# Patient Record
Sex: Female | Born: 1975 | Race: White | Hispanic: Yes | Marital: Married | Smoking: Never smoker
Health system: Southern US, Community
[De-identification: ages and names within clinical notes are randomized; demographics above are authoritative.]

## PROBLEM LIST (undated history)

## (undated) ENCOUNTER — Inpatient Hospital Stay (HOSPITAL_COMMUNITY): Payer: Self-pay

## (undated) DIAGNOSIS — O139 Gestational [pregnancy-induced] hypertension without significant proteinuria, unspecified trimester: Secondary | ICD-10-CM

## (undated) DIAGNOSIS — Z8619 Personal history of other infectious and parasitic diseases: Secondary | ICD-10-CM

## (undated) DIAGNOSIS — IMO0002 Reserved for concepts with insufficient information to code with codable children: Secondary | ICD-10-CM

## (undated) DIAGNOSIS — O1400 Mild to moderate pre-eclampsia, unspecified trimester: Secondary | ICD-10-CM

## (undated) HISTORY — DX: Personal history of other infectious and parasitic diseases: Z86.19

## (undated) HISTORY — PX: NO PAST SURGERIES: SHX2092

---

## 2001-02-22 ENCOUNTER — Emergency Department (HOSPITAL_COMMUNITY): Admission: EM | Admit: 2001-02-22 | Discharge: 2001-02-22 | Payer: Self-pay | Admitting: Emergency Medicine

## 2001-02-22 ENCOUNTER — Encounter: Payer: Self-pay | Admitting: Emergency Medicine

## 2001-03-26 ENCOUNTER — Encounter: Payer: Self-pay | Admitting: Orthopedic Surgery

## 2001-03-26 ENCOUNTER — Encounter: Admission: RE | Admit: 2001-03-26 | Discharge: 2001-03-26 | Payer: Self-pay | Admitting: Orthopedic Surgery

## 2001-07-06 ENCOUNTER — Inpatient Hospital Stay (HOSPITAL_COMMUNITY): Admission: AD | Admit: 2001-07-06 | Discharge: 2001-07-06 | Payer: Self-pay | Admitting: Obstetrics & Gynecology

## 2001-07-14 ENCOUNTER — Inpatient Hospital Stay (HOSPITAL_COMMUNITY): Admission: AD | Admit: 2001-07-14 | Discharge: 2001-07-14 | Payer: Self-pay | Admitting: *Deleted

## 2001-07-14 ENCOUNTER — Encounter: Payer: Self-pay | Admitting: *Deleted

## 2001-09-18 ENCOUNTER — Other Ambulatory Visit: Admission: RE | Admit: 2001-09-18 | Discharge: 2001-09-18 | Payer: Self-pay | Admitting: *Deleted

## 2002-03-08 ENCOUNTER — Inpatient Hospital Stay (HOSPITAL_COMMUNITY): Admission: AD | Admit: 2002-03-08 | Discharge: 2002-03-08 | Payer: Self-pay | Admitting: *Deleted

## 2002-03-15 ENCOUNTER — Encounter (INDEPENDENT_AMBULATORY_CARE_PROVIDER_SITE_OTHER): Payer: Self-pay

## 2002-03-15 ENCOUNTER — Inpatient Hospital Stay (HOSPITAL_COMMUNITY): Admission: AD | Admit: 2002-03-15 | Discharge: 2002-03-19 | Payer: Self-pay | Admitting: Obstetrics and Gynecology

## 2002-04-16 ENCOUNTER — Other Ambulatory Visit: Admission: RE | Admit: 2002-04-16 | Discharge: 2002-04-16 | Payer: Self-pay | Admitting: *Deleted

## 2004-02-09 ENCOUNTER — Encounter: Admission: RE | Admit: 2004-02-09 | Discharge: 2004-02-09 | Payer: Self-pay | Admitting: Obstetrics and Gynecology

## 2004-02-17 ENCOUNTER — Emergency Department (HOSPITAL_COMMUNITY): Admission: EM | Admit: 2004-02-17 | Discharge: 2004-02-17 | Payer: Self-pay | Admitting: Emergency Medicine

## 2005-03-07 ENCOUNTER — Emergency Department (HOSPITAL_COMMUNITY): Admission: EM | Admit: 2005-03-07 | Discharge: 2005-03-07 | Payer: Self-pay | Admitting: *Deleted

## 2005-03-16 ENCOUNTER — Emergency Department (HOSPITAL_COMMUNITY): Admission: EM | Admit: 2005-03-16 | Discharge: 2005-03-16 | Payer: Self-pay | Admitting: Emergency Medicine

## 2005-08-16 ENCOUNTER — Other Ambulatory Visit: Admission: RE | Admit: 2005-08-16 | Discharge: 2005-08-16 | Payer: Self-pay | Admitting: Family Medicine

## 2007-10-28 ENCOUNTER — Other Ambulatory Visit: Admission: RE | Admit: 2007-10-28 | Discharge: 2007-10-28 | Payer: Self-pay | Admitting: Family Medicine

## 2009-10-06 ENCOUNTER — Inpatient Hospital Stay (HOSPITAL_COMMUNITY): Admission: AD | Admit: 2009-10-06 | Discharge: 2009-10-10 | Payer: Self-pay | Admitting: Obstetrics and Gynecology

## 2010-10-21 LAB — COMPREHENSIVE METABOLIC PANEL
AST: 22 U/L (ref 0–37)
AST: 55 U/L — ABNORMAL HIGH (ref 0–37)
Albumin: 2.2 g/dL — ABNORMAL LOW (ref 3.5–5.2)
Alkaline Phosphatase: 119 U/L — ABNORMAL HIGH (ref 39–117)
Alkaline Phosphatase: 85 U/L (ref 39–117)
CO2: 26 mEq/L (ref 19–32)
Calcium: 8.2 mg/dL — ABNORMAL LOW (ref 8.4–10.5)
Chloride: 106 mEq/L (ref 96–112)
Creatinine, Ser: 0.53 mg/dL (ref 0.4–1.2)
GFR calc Af Amer: >60 mL/min (ref >60)
GFR calc non Af Amer: >60 mL/min (ref >60)
GFR calc non Af Amer: >60 mL/min (ref >60)
Glucose, Bld: 94 mg/dL (ref 70–99)
Potassium: 3.5 mEq/L (ref 3.5–5.1)
Potassium: 3.9 mEq/L (ref 3.5–5.1)
Sodium: 138 mEq/L (ref 135–145)
Total Bilirubin: 0.3 mg/dL (ref 0.3–1.2)
Total Bilirubin: 0.3 mg/dL (ref 0.3–1.2)
Total Protein: 5.9 g/dL — ABNORMAL LOW (ref 6.0–8.3)

## 2010-10-21 LAB — CBC
HCT: 32 % — ABNORMAL LOW (ref 36.0–46.0)
Hemoglobin: 11.9 g/dL — ABNORMAL LOW (ref 12.0–15.0)
MCHC: 33.5 g/dL (ref 30.0–36.0)
Platelets: 235 10*3/uL (ref 150–400)
RBC: 3.24 MIL/uL — ABNORMAL LOW (ref 3.87–5.11)
RBC: 3.71 MIL/uL — ABNORMAL LOW (ref 3.87–5.11)
RBC: 4.07 MIL/uL (ref 3.87–5.11)
RDW: 16.9 % — ABNORMAL HIGH (ref 11.5–15.5)
WBC: 6.1 10*3/uL (ref 4.0–10.5)

## 2010-10-21 LAB — RPR: RPR Ser Ql: NONREACTIVE

## 2010-10-21 LAB — URIC ACID: Uric Acid, Serum: 5.2 mg/dL (ref 2.4–7.0)

## 2010-10-21 LAB — MAGNESIUM: Magnesium: 4.6 mg/dL — ABNORMAL HIGH (ref 1.5–2.5)
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
## 2010-12-14 NOTE — Discharge Summary (Signed)
NAME:  OLUKEMI, PANCHAL                        ACCOUNT NO.:  [id number]   MEDICAL RECORD NO.:  [id number]                   PATIENT TYPE:  INP   LOCATION:  9146                                 FACILITY:  [facility]   PHYSICIAN:  Sheronette A. Cherly Hensen, M.D.         DATE OF BIRTH:  June 14, 1976   DATE OF ADMISSION:  03/15/2002  DATE OF DISCHARGE:  03/19/2002                                 DISCHARGE SUMMARY   ADMISSION DIAGNOSES:  1. Intrauterine gestation at 40 and 4/7 weeks.  2. Post dates.   DISCHARGE DIAGNOSES:  1. Term gestation delivered.  2. Postpartum endometritis.  3. Gestational anemia.   HISTORY OF PRESENT ILLNESS:  A 35 year old Hispanic female at 4 and 4/[redacted]  weeks gestation admitted by Dr. Delia Heady for induction of labor secondary  to post dates.   HOSPITAL COURSE:  The patient was admitted to [facility].  Her  cervical examination was 1, 50%, -1.  She was given Cervidil and started  subsequently with Pitocin the next day.  She underwent amniotomy, clear  fluid noted.  The patient subsequently progressed quickly, pushed well.  However, with variable decelerations noted on external monitor.  The patient  subsequently pushed for an hour and a half.  A midline episiotomy was  subsequently performed.  Delivery of a live female was accomplished.  Cord  around the neck x2 which was reducible.  Moderate meconium was noted.  Apgars of 3, 5, and 8 by the neonatology team.  Cord pH was performed.  Placenta was meconium stained and sent to pathology.  Pathology revealed a  mature meconium stained placenta.  Weight of the baby was 7 pounds 9 ounces,  21 inches long.  Midline episiotomy without extension repaired.  On the day  of delivery the patient spiked a temperature to 101.2.  She was subsequently  started on intravenous Cefotetan 2 g q.12h.  Antibiotics discontinued as the  patient was afebrile for 24 hours.  On postpartum day number three patient  was afebrile.  Abdomen  was not tender.  Her CBC on postpartum day number one  showed a hemoglobin of 9.6, hematocrit 28.3, platelet count 266,000, white  count 16.3.  She was admitted with a hemoglobin of 10.5.   DISPOSITION:  Home.   CONDITION ON DISCHARGE:  Stable.   DISCHARGE MEDICATIONS:  1. Cephalexin 500 mg one p.o. q.6h. for 10 days.  2. Prenatal vitamins one p.o. q.d.  3. Ferrous sulfate one p.o. q.d.   FOLLOW UP:  At [facility] in one week.    DISCHARGE INSTRUCTIONS:  Call for temperature greater or equal to 100.4.  Nothing per vagina for four to six weeks.  No heavy lifting or driving for  two to three weeks.  Call if increased vaginal bleeding, severe abdominal  pain.  Sheronette A. Cherly Hensen, M.D.    SAC/MEDQ  D:  04/16/2002  T:  04/19/2002  Job:  [id number]

## 2010-12-14 NOTE — Consult Note (Signed)
   NAME:  ERISA, MEHLMAN NO.:  [id number]   MEDICAL RECORD NO.:  [id number]                   PATIENT TYPE:   LOCATION:                                       FACILITY:   PHYSICIAN:  Lenoard Aden, M.D.             DATE OF BIRTH:   DATE OF CONSULTATION:  03/08/2002  DATE OF DISCHARGE:                                OB/GYN CONSULTATION   CHIEF COMPLAINT:  Rule out labor.   HISTORY OF PRESENT ILLNESS:  The patient is a G1, P0, Hispanic female at 40+  weeks, who presents with active complaints of contractions.   MEDICATIONS:  Prenatal vitamins.   PAST MEDICAL HISTORY:  Noncontributory.   FAMILY HISTORY:  Noncontributory.   SOCIAL HISTORY:  Noncontributory.   PHYSICAL EXAMINATION:  GENERAL:  She is a well-developed, well-nourished,  Hispanic female in no apparent distress.  HEENT:  Normal.  LUNGS:  Clear.  HEART:  Regular rhythm.  ABDOMEN:  Soft, gravid, nontender, no right upper quadrant tenderness.  PULSES:  DTRs 2+.  No evidence of clonus.  PELVIC:  Cervix is 1-2 cm, 50%, thick, vertex, -2, and posterior.  EXTREMITIES:  No cords.  NEUROLOGIC:  Nonfocal.  SKIN:  Nonfocal.   FETAL MONITORING:  NST is reactive.  Fetal heart rate 150-160 with  accelerations, irregular contractions after ambulation.  No cervical change  is noted.   IMPRESSION:  1. Prodromal labor pattern.  2. No evidence of active labor.   PLAN:  1. Discharge home.  2. Ambien pack given.  3. Follow up in the office one day.                                              Lenoard Aden, M.D.   RJT/MEDQ  D:  03/08/2002  T:  03/08/2002  Job:  [id number]

## 2011-02-19 ENCOUNTER — Other Ambulatory Visit: Payer: Self-pay | Admitting: Obstetrics & Gynecology

## 2011-03-29 LAB — GC/CHLAMYDIA PROBE AMP, GENITAL
Chlamydia: NEGATIVE
Gonorrhea: NEGATIVE

## 2011-03-29 LAB — RUBELLA ANTIBODY, IGM: Rubella: IMMUNE

## 2011-03-29 LAB — HIV ANTIBODY (ROUTINE TESTING W REFLEX): HIV: NONREACTIVE

## 2011-03-29 LAB — SYPHILIS: RPR W/REFLEX TO RPR TITER AND TREPONEMAL ANTIBODIES, TRADITIONAL SCREENING AND DIAGNOSIS ALGORITHM: RPR: NONREACTIVE

## 2011-07-11 LAB — ABO/RH: RH Type: POSITIVE

## 2011-07-11 LAB — HIV ANTIBODY (ROUTINE TESTING W REFLEX): HIV: NONREACTIVE

## 2011-07-11 LAB — RPR: RPR: NONREACTIVE

## 2011-07-30 NOTE — L&D Delivery Note (Signed)
Patient was C/C/2 and pushed for 10 minutes with epidural.   NSVD  female infant, Apgars 9/9, weight 8#5.   The patient had a second degree perineal laceration repaired with 2=0 and 3-0 vicryl. Fundus was firm. EBL was expected. Placenta was delivered intact. Vagina was clear.  Baby was vigorous to bedside.  Debra Montgomery

## 2011-08-30 DIAGNOSIS — O479 False labor, unspecified: Secondary | ICD-10-CM

## 2011-09-01 ENCOUNTER — Inpatient Hospital Stay (HOSPITAL_COMMUNITY)
Admission: AD | Admit: 2011-09-01 | Discharge: 2011-09-01 | Disposition: A | Discharge status: Home / Self Care | Payer: 59 | Source: Ambulatory Visit | Attending: Obstetrics and Gynecology | Admitting: Obstetrics and Gynecology

## 2011-09-01 ENCOUNTER — Encounter (HOSPITAL_COMMUNITY): Payer: Self-pay | Admitting: *Deleted

## 2011-09-01 DIAGNOSIS — O4700 False labor before 37 completed weeks of gestation, unspecified trimester: Secondary | ICD-10-CM | POA: Insufficient documentation

## 2011-09-01 DIAGNOSIS — O2210 Genital varices in pregnancy, unspecified trimester: Secondary | ICD-10-CM | POA: Insufficient documentation

## 2011-09-01 DIAGNOSIS — N949 Unspecified condition associated with female genital organs and menstrual cycle: Secondary | ICD-10-CM | POA: Insufficient documentation

## 2011-09-01 DIAGNOSIS — O479 False labor, unspecified: Secondary | ICD-10-CM

## 2011-09-01 LAB — URINALYSIS, ROUTINE W REFLEX MICROSCOPIC
Bilirubin Urine: NEGATIVE
Glucose, UA: NEGATIVE mg/dL
Ketones, ur: NEGATIVE mg/dL
Specific Gravity, Urine: 1.025 (ref 1.005–1.030)
Urobilinogen, UA: 1 mg/dL (ref 0.0–1.0)
pH: 6 (ref 5.0–8.0)

## 2011-09-01 MED ORDER — NIFEDIPINE 10 MG PO CAPS
ORAL_CAPSULE | ORAL | Status: AC
  Administered 2011-09-01: 10 mg via ORAL
  Filled 2011-09-01: qty 1

## 2011-09-01 MED ORDER — NIFEDIPINE 10 MG PO CAPS: 10.0000 mg | ORAL_CAPSULE | Freq: Once | ORAL | Status: DC

## 2011-09-01 NOTE — ED Provider Notes (Signed)
History     Chief Complaint  Patient presents with  . Pelvic Pain   HPI Debra Montgomery is 36 y.o. G3P2002 [redacted] wks gestation presents  with pressure in her lower abdomen=points to suprapubic bone , began at 10pm last night.  Described as 'heavy", laid down for a while on her left side, went to bathroom and saw a little blood.  Went back to bed and called MD this am.   Painful to stand up and walk.   No pain with resting.  Denies leaking of fluid.  Hx of preeclampsia in previous pregnancy.  Pt denies PIH sxs with this pregnancy.   No past medical history on file.  No past surgical history on file.  No family history on file.  History  Substance Use Topics  . Smoking status: Not on file  . Smokeless tobacco: Not on file  . Alcohol Use: Not on file    Allergies: No Known Allergies  Prescriptions prior to admission  Medication Sig Dispense Refill  . acetaminophen (TYLENOL) 325 MG tablet Take 650 mg by mouth every 6 (six) hours as needed. For pain      . ferrous sulfate 325 (65 FE) MG tablet Take 325 mg by mouth daily with breakfast.      . Prenatal Vit-Fe Fumarate-FA (PRENATAL MULTIVITAMIN) TABS Take 1 tablet by mouth daily.        Review of Systems  Constitutional: Negative.   HENT: Negative.   Respiratory: Negative.   Cardiovascular: Negative.   Gastrointestinal: Positive for abdominal pain (lower abdominal pressure).  Genitourinary:       + for blood    Physical Exam   Blood pressure 117/61, pulse 76, temperature 98.9 F (37.2 C), temperature source Oral, resp. rate 18, height 5\' 5"  (1.651 m), weight 170 lb (77.111 kg).  Physical Exam  Constitutional: She is oriented to person, place, and time. She appears well-developed and well-nourished. No distress.  HENT:  Head: Normocephalic.  Neck: Normal range of motion.  Cardiovascular: Normal rate.   Respiratory: Effort normal.  GI: Soft. There is tenderness (mild lower abdominal tenderness).  Genitourinary: Uterus is  enlarged. Uterus is not tender. No bleeding around the vagina. Vaginal discharge (white normal appearing without odor.   Neg for pooling of fluid) found.       Varicosities noted just inside the introitus  CERVICAL EXAM by Carollee Herter, RN 1cm diltated, 30% effaced and thick.  Neurological: She is alert and oriented to person, place, and time.  Skin: Skin is warm and dry.    Results for orders placed during the hospital encounter of 09/01/11 (from the past 24 hour(s))  URINALYSIS, ROUTINE W REFLEX MICROSCOPIC     Status: Normal   Collection Time   09/01/11  4:40 PM      Component Value Range   Color, Urine YELLOW  YELLOW    APPearance CLEAR  CLEAR    Specific Gravity, Urine 1.025  1.005 - 1.030    pH 6.0  5.0 - 8.0    Glucose, UA NEGATIVE  NEGATIVE (mg/dL)   Hgb urine dipstick NEGATIVE  NEGATIVE    Bilirubin Urine NEGATIVE  NEGATIVE    Ketones, ur NEGATIVE  NEGATIVE (mg/dL)   Protein, ur NEGATIVE  NEGATIVE (mg/dL)   Urobilinogen, UA 1.0  0.0 - 1.0 (mg/dL)   Nitrite NEGATIVE  NEGATIVE    Leukocytes, UA NEGATIVE  NEGATIVE    FMS  Baseline 140, moderate variability.  Not yet reactive.  3 contractions  in 40 minutes.   At 18:23 strip is reactive.  Baseline 150.  Contractions q 6-7 minute   Order given to recheck cervix.  If unchanged, discharge to home.   CERVICAL EXAM at 18:31  Unchanged from previous exam per Carollee Herter, RN.  MAU Course  Procedures  MDM 18:31  Reported FMS, physical exam findings, and cervical exam to Dr. Henderson Cloud.  Order given for Procardia 10mg  po now.   May discharge to home.  Procardia 10mg  po given in MAU  Assessment and Plan  A:  Braxton-Hicks contractions      Vaginal varicosities  P:  Keep scheduled appointment.        Call Dr. Tenny Craw if contractions increase  KEY,EVE M 09/01/2011, 5:18 PM   Matt Holmes, NP 09/01/11 1845  Matt Holmes, NP 09/01/11 1610

## 2011-09-01 NOTE — Progress Notes (Signed)
Apple juice given to pt  

## 2011-09-01 NOTE — Progress Notes (Signed)
Pt reports increased pelvic pressure that started last night. Reports she had some vaginal bleeding as well that has stopped today. Reports good fetal  Movement.

## 2011-09-01 NOTE — Progress Notes (Signed)
Updated E Key NP on UA results.

## 2011-09-26 ENCOUNTER — Inpatient Hospital Stay (HOSPITAL_COMMUNITY)
Admission: AD | Admit: 2011-09-26 | Discharge: 2011-09-26 | Disposition: A | Discharge status: Home / Self Care | Payer: 59 | Source: Ambulatory Visit | Attending: Obstetrics and Gynecology | Admitting: Obstetrics and Gynecology

## 2011-09-26 ENCOUNTER — Encounter (HOSPITAL_COMMUNITY): Payer: Self-pay | Admitting: *Deleted

## 2011-09-26 DIAGNOSIS — O479 False labor, unspecified: Secondary | ICD-10-CM | POA: Insufficient documentation

## 2011-09-26 HISTORY — DX: Mild to moderate pre-eclampsia, unspecified trimester: O14.00

## 2011-09-26 LAB — CBC
HCT: 37.1 % (ref 36.0–46.0)
MCH: 27.3 pg (ref 26.0–34.0)
MCHC: 32.3 g/dL (ref 30.0–36.0)
WBC: 8.5 10*3/uL (ref 4.0–10.5)

## 2011-09-26 LAB — COMPREHENSIVE METABOLIC PANEL
CO2: 22 mEq/L (ref 19–32)
Calcium: 8.5 mg/dL (ref 8.4–10.5)
Creatinine, Ser: 0.42 mg/dL — ABNORMAL LOW (ref 0.50–1.10)
GFR calc Af Amer: >90 mL/min (ref >90)
GFR calc non Af Amer: >90 mL/min (ref >90)
Glucose, Bld: 100 mg/dL — ABNORMAL HIGH (ref 70–99)

## 2011-09-26 LAB — URINALYSIS, ROUTINE W REFLEX MICROSCOPIC
Hgb urine dipstick: NEGATIVE
Specific Gravity, Urine: 1.015 (ref 1.005–1.030)

## 2011-09-26 LAB — LACTATE DEHYDROGENASE: LDH: 176 U/L (ref 94–250)

## 2011-09-26 NOTE — ED Provider Notes (Signed)
History     CSN: [id number]  Arrival date & time 09/26/11  1054   None     Chief Complaint  Patient presents with  . Labor Eval    HPI Debra Montgomery is a 36 y.o. female @ [redacted]w[redacted]d  who presents to MAU for contractions. Her blood pressure was slightly elevated on arrival.  Past Medical History  Diagnosis Date  . Pre-eclampsia, mild     with previous delivery    Past Surgical History  Procedure Date  . No past surgeries     No family history on file.  History  Substance Use Topics  . Smoking status: Never Smoker   . Smokeless tobacco: Not on file  . Alcohol Use: No    OB History    Grav Para Term Preterm Abortions TAB SAB Ect Mult Living   3 2 2  0 0 0 0 0 0 2      Review of Systems: As stated in HPI.  Allergies  Review of patient's allergies indicates no known allergies.  Home Medications  No current outpatient prescriptions on file.  BP 129/85  Pulse 83  Temp(Src) 98.2 F (36.8 C) (Oral)  Resp 18  Physical Exam  Nursing note and vitals reviewed. Constitutional: She is oriented to person, place, and time. She appears well-developed and well-nourished.  HENT:  Head: Normocephalic.  Eyes: EOM are normal.  Neck: Neck supple.  Cardiovascular: Normal rate.   Pulmonary/Chest: Effort normal.  Abdominal: Soft.       Gravid, pain with contractions.  Musculoskeletal: Normal range of motion.  Neurological: She is alert and oriented to person, place, and time. No cranial nerve deficit.  Skin: Skin is warm and dry.  Psychiatric: She has a normal mood and affect. Her behavior is normal. Judgment and thought content normal.    ED Course: Dr. Henderson Cloud notified and request [facility] labs be drawn.  Procedures  Results for orders placed during the hospital encounter of 09/26/11 (from the past 24 hour(s))  CBC     Status: Abnormal   Collection Time   09/26/11 11:40 AM      Component Value Range   WBC 8.5  4.0 - 10.5 (K/uL)   RBC 4.40  3.87 - 5.11 (MIL/uL)   Hemoglobin 12.0  12.0 - 15.0 (g/dL)   HCT 08.6  57.8 - 46.9 (%)   MCV 84.3  78.0 - 100.0 (fL)   MCH 27.3  26.0 - 34.0 (pg)   MCHC 32.3  30.0 - 36.0 (g/dL)   RDW 62.9 (*) 52.8 - 15.5 (%)   Platelets 240  150 - 400 (K/uL)  COMPREHENSIVE METABOLIC PANEL     Status: Abnormal   Collection Time   09/26/11 11:40 AM      Component Value Range   Sodium 132 (*) 135 - 145 (mEq/L)   Potassium 3.8  3.5 - 5.1 (mEq/L)   Chloride 100  96 - 112 (mEq/L)   CO2 22  19 - 32 (mEq/L)   Glucose, Bld 100 (*) 70 - 99 (mg/dL)   BUN 9  6 - 23 (mg/dL)   Creatinine, Ser 4.13 (*) 0.50 - 1.10 (mg/dL)   Calcium 8.5  8.4 - 24.4 (mg/dL)   Total Protein 6.7  6.0 - 8.3 (g/dL)   Albumin 2.7 (*) 3.5 - 5.2 (g/dL)   AST 16  0 - 37 (U/L)   ALT 14  0 - 35 (U/L)   Alkaline Phosphatase 126 (*) 39 - 117 (U/L)  Total Bilirubin 0.3  0.3 - 1.2 (mg/dL)   GFR calc non Af Amer >90  >90 (mL/min)   GFR calc Af Amer >90  >90 (mL/min)  URIC ACID     Status: Normal   Collection Time   09/26/11 11:40 AM      Component Value Range   Uric Acid, Serum 4.2  2.4 - 7.0 (mg/dL)  LACTATE DEHYDROGENASE     Status: Normal   Collection Time   09/26/11 11:40 AM      Component Value Range   LD 176  94 - 250 (U/L)  URINALYSIS, ROUTINE W REFLEX MICROSCOPIC     Status: Abnormal   Collection Time   09/26/11 12:00 PM      Component Value Range   Color, Urine YELLOW  YELLOW    APPearance CLEAR  CLEAR    Specific Gravity, Urine 1.015  1.005 - 1.030    pH 6.5  5.0 - 8.0    Glucose, UA NEGATIVE  NEGATIVE (mg/dL)   Hgb urine dipstick NEGATIVE  NEGATIVE    Bilirubin Urine NEGATIVE  NEGATIVE    Ketones, ur 15 (*) NEGATIVE (mg/dL)   Protein, ur NEGATIVE  NEGATIVE (mg/dL)   Urobilinogen, UA 0.2  0.0 - 1.0 (mg/dL)   Nitrite NEGATIVE  NEGATIVE    Leukocytes, UA NEGATIVE  NEGATIVE    MDM: EFM tracing reactive, contractions every 6 to 8 minutes, cervix 1.5 cm, 60%, -2.   Assessment: False Labor  Plan:  Follow up in the office   Return here as  needed.       [place], [place] 09/26/11 [phone]

## 2011-09-26 NOTE — Progress Notes (Signed)
Pt reports having contraction on and off all morning. Re[ports 5-6 min apart. Denies SROM or bleeding ng reports good fetal movement

## 2011-09-26 NOTE — H&P (Signed)
36 y.o. [redacted]w[redacted]d  G3P2002 comes in c/o contractions.  Otherwise has good fetal movement and no bleeding.  Found to have some elevated BPs but no s/s preeclampsia.  Past Medical History  Diagnosis Date  . Pre-eclampsia, mild     with previous delivery    Past Surgical History  Procedure Date  . No past surgeries     OB History    Grav Para Term Preterm Abortions TAB SAB Ect Mult Living   3 2 2  0 0 0 0 0 0 2     # Outc Date GA Lbr Len/2nd Wgt Sex Del Anes PTL Lv   1 TRM            2 TRM            3 CUR               History   Social History  . Marital Status: Married    Spouse Name: N/A    Number of Children: N/A  . Years of Education: N/A   Occupational History  . Not on file.   Social History Main Topics  . Smoking status: Never Smoker   . Smokeless tobacco: Not on file  . Alcohol Use: No  . Drug Use: No  . Sexually Active: Yes   Other Topics Concern  . Not on file   Social History Narrative  . No narrative on file   Review of patient's allergies indicates no known allergies.   Prenatal Course:  Uncomplicated; AMA but Quad screen normal.  Filed Vitals:   09/26/11 1202 09/26/11 1218 09/26/11 1233 09/26/11 1248  BP: 134/86 143/86 130/79 141/88  Pulse: 80 76 85 83  Temp:      TempSrc:      Resp:       Lungs/Cor:  NAD Abdomen:  soft, gravid Ex:  no cords, erythema SVE:  See NP note FHTs: NST R Toco:  q6-8  Results for orders placed during the hospital encounter of 09/26/11 (from the past 48 hour(s))  CBC     Status: Abnormal   Collection Time   09/26/11 11:40 AM      Component Value Range Comment   WBC 8.5  4.0 - 10.5 (K/uL)    RBC 4.40  3.87 - 5.11 (MIL/uL)    Hemoglobin 12.0  12.0 - 15.0 (g/dL)    HCT 16.1  09.6 - 04.5 (%)    MCV 84.3  78.0 - 100.0 (fL)    MCH 27.3  26.0 - 34.0 (pg)    MCHC 32.3  30.0 - 36.0 (g/dL)    RDW 40.9 (*) 81.1 - 15.5 (%)    Platelets 240  150 - 400 (K/uL)   COMPREHENSIVE METABOLIC PANEL     Status: Abnormal   Collection Time   09/26/11 11:40 AM      Component Value Range Comment   Sodium 132 (*) 135 - 145 (mEq/L)    Potassium 3.8  3.5 - 5.1 (mEq/L)    Chloride 100  96 - 112 (mEq/L)    CO2 22  19 - 32 (mEq/L)    Glucose, Bld 100 (*) 70 - 99 (mg/dL)    BUN 9  6 - 23 (mg/dL)    Creatinine, Ser 9.14 (*) 0.50 - 1.10 (mg/dL)    Calcium 8.5  8.4 - 10.5 (mg/dL)    Total Protein 6.7  6.0 - 8.3 (g/dL)    Albumin 2.7 (*) 3.5 - 5.2 (g/dL)  AST 16  0 - 37 (U/L)    ALT 14  0 - 35 (U/L) REPEATED TO VERIFY   Alkaline Phosphatase 126 (*) 39 - 117 (U/L)    Total Bilirubin 0.3  0.3 - 1.2 (mg/dL)    GFR calc non Af Amer >90  >90 (mL/min)    GFR calc Af Amer >90  >90 (mL/min)   URIC ACID     Status: Normal   Collection Time   09/26/11 11:40 AM      Component Value Range Comment   Uric Acid, Serum 4.2  2.4 - 7.0 (mg/dL)   LACTATE DEHYDROGENASE     Status: Normal   Collection Time   09/26/11 11:40 AM      Component Value Range Comment   LD 176  94 - 250 (U/L)   URINALYSIS, ROUTINE W REFLEX MICROSCOPIC     Status: Abnormal   Collection Time   09/26/11 12:00 PM      Component Value Range Comment   Color, Urine YELLOW  YELLOW     APPearance CLEAR  CLEAR     Specific Gravity, Urine 1.015  1.005 - 1.030     pH 6.5  5.0 - 8.0     Glucose, UA NEGATIVE  NEGATIVE (mg/dL)    Hgb urine dipstick NEGATIVE  NEGATIVE     Bilirubin Urine NEGATIVE  NEGATIVE     Ketones, ur 15 (*) NEGATIVE (mg/dL)    Protein, ur NEGATIVE  NEGATIVE (mg/dL)    Urobilinogen, UA 0.2  0.0 - 1.0 (mg/dL)    Nitrite NEGATIVE  NEGATIVE     Leukocytes, UA NEGATIVE  NEGATIVE  MICROSCOPIC NOT DONE ON URINES WITH NEGATIVE PROTEIN, BLOOD, LEUKOCYTES, NITRITE, OR GLUCOSE <1000 mg/dL.   A/P   Term pregnancy with mild contractions.  If doesn't change cervix, d/c to home.  GBS positive.    Abryana Lykens A

## 2011-09-26 NOTE — Progress Notes (Signed)
No change in SVE. B/p 130-140-70-80. OK to sent pt home.

## 2011-09-26 NOTE — Progress Notes (Signed)
Notified of SVE 1.5/long/posterior and b/p elevated. PIH labs ordered.

## 2011-09-26 NOTE — Progress Notes (Signed)
Notified of no change in SVE and Normal PIH labs. Still wants to monitor pt x1 hr and recheck cervix.

## 2011-09-30 ENCOUNTER — Encounter (HOSPITAL_COMMUNITY): Payer: Self-pay

## 2011-09-30 ENCOUNTER — Inpatient Hospital Stay (HOSPITAL_COMMUNITY)
Admission: AD | Admit: 2011-09-30 | Discharge: 2011-09-30 | Disposition: A | Discharge status: Home / Self Care | Payer: 59 | Source: Ambulatory Visit | Attending: Obstetrics and Gynecology | Admitting: Obstetrics and Gynecology

## 2011-09-30 DIAGNOSIS — R519 Headache, unspecified: Secondary | ICD-10-CM

## 2011-09-30 DIAGNOSIS — R51 Headache: Secondary | ICD-10-CM

## 2011-09-30 DIAGNOSIS — O479 False labor, unspecified: Secondary | ICD-10-CM | POA: Insufficient documentation

## 2011-09-30 DIAGNOSIS — O99891 Other specified diseases and conditions complicating pregnancy: Secondary | ICD-10-CM | POA: Insufficient documentation

## 2011-09-30 DIAGNOSIS — O26899 Other specified pregnancy related conditions, unspecified trimester: Secondary | ICD-10-CM

## 2011-09-30 HISTORY — DX: Reserved for concepts with insufficient information to code with codable children: IMO0002

## 2011-09-30 HISTORY — DX: Gestational (pregnancy-induced) hypertension without significant proteinuria, unspecified trimester: O13.9

## 2011-09-30 LAB — URINALYSIS, ROUTINE W REFLEX MICROSCOPIC
Bilirubin Urine: NEGATIVE
Glucose, UA: NEGATIVE mg/dL
Ketones, ur: NEGATIVE mg/dL
Leukocytes, UA: NEGATIVE
Nitrite: NEGATIVE
Protein, ur: 30 mg/dL — AB

## 2011-09-30 LAB — CBC
HCT: 37.4 % (ref 36.0–46.0)
MCH: 27.9 pg (ref 26.0–34.0)
MCHC: 33.2 g/dL (ref 30.0–36.0)
RDW: 16.9 % — ABNORMAL HIGH (ref 11.5–15.5)

## 2011-09-30 LAB — URIC ACID: Uric Acid, Serum: 5.1 mg/dL (ref 2.4–7.0)

## 2011-09-30 LAB — COMPREHENSIVE METABOLIC PANEL
Albumin: 2.7 g/dL — ABNORMAL LOW (ref 3.5–5.2)
Alkaline Phosphatase: 127 U/L — ABNORMAL HIGH (ref 39–117)
BUN: 9 mg/dL (ref 6–23)
Calcium: 9 mg/dL (ref 8.4–10.5)
GFR calc Af Amer: >90 mL/min (ref >90)
Potassium: 3.7 mEq/L (ref 3.5–5.1)
Total Protein: 6.7 g/dL (ref 6.0–8.3)

## 2011-09-30 LAB — URINE MICROSCOPIC-ADD ON

## 2011-09-30 MED ORDER — AZITHROMYCIN 250 MG PO TABS: 1000.0000 mg | ORAL_TABLET | ORAL | Status: DC

## 2011-09-30 NOTE — Discharge Instructions (Signed)
Your labs do not indicate that you have preeclampsia during this pregnancy.  Your provider will continue to monitor you during your prenatal visits.    Preeclampsia and Eclampsia Preeclampsia is a condition of high blood pressure during pregnancy. It can happen at 20 weeks or later in pregnancy. If high blood pressure occurs in the second half of pregnancy with no other symptoms, it is called gestational hypertension and goes away after the baby is born. If any of the symptoms listed below develop with gestational hypertension, it is then called preeclampsia. Eclampsia (convulsions) may follow preeclampsia. This is one of the reasons for regular prenatal checkups. Early diagnosis and treatment are very important to prevent eclampsia. CAUSES  There is no known cause of preeclampsia/eclampsia in pregnancy. There are several known conditions that may put the pregnant woman at risk, such as:  The first pregnancy.   Having preeclampsia in a past pregnancy.   Having lasting (chronic) high blood pressure.   Having multiples (twins, triplets).   Being age 53 or older.   African American ethnic background.   Having kidney disease or diabetes.   Medical conditions such as lupus or blood diseases.   Being overweight (obese).  SYMPTOMS   High blood pressure.   Headaches.   Sudden weight gain.   Swelling of hands, face, legs, and feet.   Protein in the urine.   Feeling sick to your stomach (nauseous) and throwing up (vomiting).   Vision problems (blurred or double vision).   Numbness in the face, arms, legs, and feet.   Dizziness.   Slurred speech.   Preeclampsia can cause growth retardation in the fetus.   Separation (abruption) of the placenta.   Not enough fluid in the amniotic sac (oligohydramnios).   Sensitivity to bright lights.   Belly (abdominal) pain.  DIAGNOSIS  If protein is found in the urine in the second half of pregnancy, this is considered preeclampsia.  Other symptoms mentioned above may also be present. TREATMENT  It is necessary to treat this.  Your caregiver may prescribe bed rest early in this condition. Plenty of rest and salt restriction may be all that is needed.   Medicines may be necessary to lower blood pressure if the condition does not respond to more conservative measures.   In more severe cases, hospitalization may be needed:   For treatment of blood pressure.   To control fluid retention.   To monitor the baby to see if the condition is causing harm to the baby.   Hospitalization is the best way to treat the first sign of preeclampsia. This is so the mother and baby can be watched closely and blood tests can be done effectively and correctly.   If the condition becomes severe, it may be necessary to induce labor or to remove the infant by surgical means (cesarean section). The best cure for preeclampsia/eclampsia is to deliver the baby.  Preeclampsia and eclampsia involve risks to mother and infant. Your caregiver will discuss these risks with you. Together, you can work out the best possible approach to your problems. Make sure you keep your prenatal visits as scheduled. Not keeping appointments could result in a chronic or permanent injury, pain, disability to you, and death or injury to you or your unborn baby. If there is any problem keeping the appointment, you must call to reschedule. HOME CARE INSTRUCTIONS   Keep your prenatal appointments and tests as scheduled.   Tell your caregiver if you have any of the  above risk factors.   Get plenty of rest and sleep.   Eat a balanced diet that is low in salt, and do not add salt to your food.   Avoid stressful situations.   Only take over-the-counter and prescriptions medicines for pain, discomfort, or fever as directed by your caregiver.  SEEK IMMEDIATE MEDICAL CARE IF:   You develop severe swelling anywhere in the body. This usually occurs in the legs.   You gain  5 lb/2.3 kg or more in a week.   You develop a severe headache, dizziness, problems with your vision, or confusion.   You have abdominal pain, nausea, or vomiting.   You have a seizure.   You have trouble moving any part of your body, or you develop numbness or problems speaking.   You have bruising or abnormal bleeding from anywhere in the body.   You develop a stiff neck.   You pass out.  MAKE SURE YOU:   Understand these instructions.   Will watch your condition.   Will get help right away if you are not doing well or get worse.  Document Released: 07/12/2000 Document Revised: 07/04/2011 Document Reviewed: 02/26/2008 [facility] Patient Information 2012 [place], [place].  Normal Labor and Delivery Your caregiver must first be sure you are in labor. Signs of labor include:  You may pass what is called "the mucus plug" before labor begins. This is a small amount of blood stained mucus.   Regular uterine contractions.   The time between contractions get closer together.   The discomfort and pain gradually gets more intense.   Pains are mostly located in the back.   Pains get worse when walking.   The cervix (the opening of the uterus becomes thinner (begins to efface) and opens up (dilates).  Once you are in labor and admitted into the hospital or care center, your caregiver will do the following:  A complete physical examination.   Check your vital signs (blood pressure, pulse, temperature and the fetal heart rate).   Do a vaginal examination (using a sterile glove and lubricant) to determine:   The position (presentation) of the baby (head [vertex] or buttock first).   The level (station) of the baby's head in the birth canal.   The effacement and dilatation of the cervix.   You may have your pubic hair shaved and be given an enema depending on your caregiver and the circumstance.   An electronic monitor is usually placed on your abdomen. The monitor follows the  length and intensity of the contractions, as well as the baby's heart rate.   Usually, your caregiver will insert an IV in your arm with a bottle of sugar water. This is done as a precaution so that medications can be given to you quickly during labor or delivery.  NORMAL LABOR AND DELIVERY IS DIVIDED UP INTO 3 STAGES: First Stage This is when regular contractions begin and the cervix begins to efface and dilate. This stage can last from 3 to 15 hours. The end of the first stage is when the cervix is 100% effaced and 10 centimeters dilated. Pain medications may be given by   Injection (morphine, demerol, etc.)   Regional anesthesia (spinal, caudal or epidural, anesthetics given in different locations of the spine). Paracervical pain medication may be given, which is an injection of and anesthetic on each side of the cervix.  A pregnant woman may request to have "Natural Childbirth" which is not to have any medications or  anesthesia during her labor and delivery. Second Stage This is when the baby comes down through the birth canal (vagina) and is born. This can take 1 to 4 hours. As the baby's head comes down through the birth canal, you may feel like you are going to have a bowel movement. You will get the urge to bear down and push until the baby is delivered. As the baby's head is being delivered, the caregiver will decide if an episiotomy (a cut in the perineum and vagina area) is needed to prevent tearing of the tissue in this area. The episiotomy is sewn up after the delivery of the baby and placenta. Sometimes a mask with nitrous oxide is given for the mother to breath during the delivery of the baby to help if there is too much pain. The end of Stage 2 is when the baby is fully delivered. Then when the umbilical cord stops pulsating it is clamped and cut. Third Stage The third stage begins after the baby is completely delivered and ends after the placenta (afterbirth) is delivered. This usually  takes 5 to 30 minutes. After the placenta is delivered, a medication is given either by intravenous or injection to help contract the uterus and prevent bleeding. The third stage is not painful and pain medication is usually not necessary. If an episiotomy was done, it is repaired at this time. After the delivery, the mother is watched and monitored closely for 1 to 2 hours to make sure there is no postpartum bleeding (hemorrhage). If there is a lot of bleeding, medication is given to contract the uterus and stop the bleeding. Document Released: 04/23/2008 Document Revised: 07/04/2011 Document Reviewed: 04/23/2008 [facility] Patient Information 2012 [place], [place].

## 2011-09-30 NOTE — ED Provider Notes (Signed)
History    36 y.o.G3P2002@[redacted]w[redacted]d  presents to [facility]U with the following:  Chief Complaint  Patient presents with  . Headache  . Diarrhea  . Contractions   HPI Pt presents with painful contractions and states "I have had 2 babies before and I feel like this is labor".  Pt reports h/a x 3 days, worsening today, described as starting in her neck and radiating to top of her head.  Tylenol has not relieved her pain.  She also reports nausea with vomiting x1 today and diarrhea starting early this morning.  Her other 2 children have been sick this week.  She reports adequate water intake, good fetal movement, and denies LOF, vaginal bleeding, vaginal itching/burning, urinary symptoms, epigastric pain or visual disturbances.   OB History    Grav Para Term Preterm Abortions TAB SAB Ect Mult Living   3 2 2  0 0 0 0 0 0 2      Past Medical History  Diagnosis Date  . Pre-eclampsia, mild     with previous delivery  . Pregnancy induced hypertension   . Advanced maternal age in pregnancy     Past Surgical History  Procedure Date  . No past surgeries     Family History  Problem Relation Age of Onset  . Anesthesia problems Neg Hx   . Cancer Mother     History  Substance Use Topics  . Smoking status: Never Smoker   . Smokeless tobacco: Never Used  . Alcohol Use: No    Allergies: No Known Allergies  Prescriptions prior to admission  Medication Sig Dispense Refill  . acetaminophen (TYLENOL) 500 MG tablet Take 500 mg by mouth every 6 (six) hours as needed. Takes for headaches      . ferrous sulfate 325 (65 FE) MG tablet Take 325 mg by mouth daily with breakfast.      . loperamide (IMODIUM) 2 MG capsule Take 2 mg by mouth 4 (four) times daily as needed. Takes for Constipation.      . Prenatal Vit-Fe Fumarate-FA (PRENATAL MULTIVITAMIN) TABS Take 1 tablet by mouth every morning.         Review of Systems  Constitutional: Negative for fever, chills and malaise/fatigue.  Eyes: Negative for  blurred vision and photophobia.  Respiratory: Negative for cough and shortness of breath.   Gastrointestinal: Positive for vomiting, abdominal pain and diarrhea. Negative for nausea.       Neg epigastric pain  Neurological: Positive for headaches. Negative for dizziness.  Psychiatric/Behavioral: Negative for depression. The patient is nervous/anxious.    Physical Exam   Blood pressure 123/89, pulse 80, temperature 97.3 F (36.3 C), temperature source Oral, resp. rate 20.  Physical Exam  Nursing note and vitals reviewed. Constitutional: She is oriented to person, place, and time. She appears well-developed and well-nourished.  Neck: Normal range of motion.  Cardiovascular: Normal rate, regular rhythm and normal heart sounds.   Respiratory: Effort normal and breath sounds normal.  GI: Soft.  Genitourinary:       Cervical exam by Joana Reamer, RN, upon arrival 2.5-3/60/-3, posterior, soft  Recheck after 2 hours by S. Beck, RN: cervix unchanged  Musculoskeletal: Normal range of motion.  Neurological: She is alert and oriented to person, place, and time. She has normal reflexes.  Skin: Skin is warm and dry.  Psychiatric: She has a normal mood and affect. Her behavior is normal. Judgment and thought content normal.   Results for orders placed during the hospital encounter of 09/30/11 (from  the past 24 hour(s))  CBC     Status: Abnormal   Collection Time   09/30/11  9:33 AM      Component Value Range   WBC 9.8  4.0 - 10.5 (K/uL)   RBC 4.45  3.87 - 5.11 (MIL/uL)   Hemoglobin 12.4  12.0 - 15.0 (g/dL)   HCT 16.1  09.6 - 04.5 (%)   MCV 84.0  78.0 - 100.0 (fL)   MCH 27.9  26.0 - 34.0 (pg)   MCHC 33.2  30.0 - 36.0 (g/dL)   RDW 40.9 (*) 81.1 - 15.5 (%)   Platelets 217  150 - 400 (K/uL)  COMPREHENSIVE METABOLIC PANEL     Status: Abnormal   Collection Time   09/30/11  9:33 AM      Component Value Range   Sodium 134 (*) 135 - 145 (mEq/L)   Potassium 3.7  3.5 - 5.1 (mEq/L)   Chloride 103  96 -  112 (mEq/L)   CO2 21  19 - 32 (mEq/L)   Glucose, Bld 108 (*) 70 - 99 (mg/dL)   BUN 9  6 - 23 (mg/dL)   Creatinine, Ser 9.14 (*) 0.50 - 1.10 (mg/dL)   Calcium 9.0  8.4 - 78.2 (mg/dL)   Total Protein 6.7  6.0 - 8.3 (g/dL)   Albumin 2.7 (*) 3.5 - 5.2 (g/dL)   AST 15  0 - 37 (U/L)   ALT 14  0 - 35 (U/L)   Alkaline Phosphatase 127 (*) 39 - 117 (U/L)   Total Bilirubin 0.2 (*) 0.3 - 1.2 (mg/dL)   GFR calc non Af Amer >90  >90 (mL/min)   GFR calc Af Amer >90  >90 (mL/min)  URIC ACID     Status: Normal   Collection Time   09/30/11  9:33 AM      Component Value Range   Uric Acid, Serum 5.1  2.4 - 7.0 (mg/dL)  URINALYSIS, ROUTINE W REFLEX MICROSCOPIC     Status: Abnormal   Collection Time   09/30/11  9:48 AM      Component Value Range   Color, Urine YELLOW  YELLOW    APPearance CLEAR  CLEAR    Specific Gravity, Urine 1.020  1.005 - 1.030    pH 6.0  5.0 - 8.0    Glucose, UA NEGATIVE  NEGATIVE (mg/dL)   Hgb urine dipstick NEGATIVE  NEGATIVE    Bilirubin Urine NEGATIVE  NEGATIVE    Ketones, ur NEGATIVE  NEGATIVE (mg/dL)   Protein, ur 30 (*) NEGATIVE (mg/dL)   Urobilinogen, UA 0.2  0.0 - 1.0 (mg/dL)   Nitrite NEGATIVE  NEGATIVE    Leukocytes, UA NEGATIVE  NEGATIVE   URINE MICROSCOPIC-ADD ON     Status: Abnormal   Collection Time   09/30/11  9:48 AM      Component Value Range   Squamous Epithelial / LPF FEW (*) RARE    WBC, UA 0-2  <3 (WBC/hpf)   Urine-Other MUCOUS PRESENT     MAU Course  Procedures U/a, CBC, CMP, uric acid  MDM Called Dr Dareen Piano to discuss lab results and cervical exam.  Plan to D/C home with f/u in office this week. Pt reports diarrhea and n/v resolved and she denies need for additional treatment for symptoms. Pt continues to breathe with contractions every few minutes but reports more comfort in between contractions than when she arrived and agrees with POC for d/c.  Assessment and Plan  A: H/A in pregnancy Braxton-Hicks contractions  P: D/C home with labor  and preeclampsia precautions F/U in office at prenatal appt this week for BP check and urine dip Return to MAU as needed  LEFTWICH-KIRBY, Alayjah Boehringer 09/30/2011, 9:59 AM

## 2011-09-30 NOTE — Progress Notes (Signed)
Pt states her 2 kids have had n/v/d for days, has had diarrhea since 0300 this am. Intermittent ctx's. Abdomen palpates soft.

## 2011-10-07 ENCOUNTER — Encounter (HOSPITAL_COMMUNITY): Payer: Self-pay | Admitting: *Deleted

## 2011-10-07 ENCOUNTER — Telehealth (HOSPITAL_COMMUNITY): Payer: Self-pay | Admitting: *Deleted

## 2011-10-07 NOTE — Telephone Encounter (Signed)
Preadmission screen  

## 2011-10-13 ENCOUNTER — Encounter (HOSPITAL_COMMUNITY): Payer: Self-pay | Admitting: Anesthesiology

## 2011-10-13 ENCOUNTER — Encounter (HOSPITAL_COMMUNITY): Payer: Self-pay | Admitting: *Deleted

## 2011-10-13 ENCOUNTER — Inpatient Hospital Stay (HOSPITAL_COMMUNITY): Payer: 59 | Admitting: Anesthesiology

## 2011-10-13 ENCOUNTER — Inpatient Hospital Stay (HOSPITAL_COMMUNITY)
Admission: AD | Admit: 2011-10-13 | Discharge: 2011-10-15 | DRG: 767 | Disposition: A | Discharge status: Home / Self Care | Payer: 59 | Source: Ambulatory Visit | Attending: Obstetrics and Gynecology | Admitting: Obstetrics and Gynecology

## 2011-10-13 ENCOUNTER — Inpatient Hospital Stay (HOSPITAL_COMMUNITY): Admission: RE | Admit: 2011-10-13 | Payer: 59 | Source: Ambulatory Visit

## 2011-10-13 DIAGNOSIS — O09529 Supervision of elderly multigravida, unspecified trimester: Secondary | ICD-10-CM | POA: Diagnosis present

## 2011-10-13 DIAGNOSIS — Z22330 Carrier of Group B streptococcus: Secondary | ICD-10-CM

## 2011-10-13 DIAGNOSIS — O99892 Other specified diseases and conditions complicating childbirth: Secondary | ICD-10-CM | POA: Diagnosis present

## 2011-10-13 DIAGNOSIS — Z302 Encounter for sterilization: Secondary | ICD-10-CM

## 2011-10-13 LAB — CBC
HCT: 36.8 % (ref 36.0–46.0)
Hemoglobin: 12.3 g/dL (ref 12.0–15.0)
MCHC: 33.4 g/dL (ref 30.0–36.0)
RBC: 4.34 MIL/uL (ref 3.87–5.11)

## 2011-10-13 MED ORDER — CITRIC ACID-SODIUM CITRATE 334-500 MG/5ML PO SOLN: 30.0000 mL | ORAL | Status: DC | PRN

## 2011-10-13 MED ORDER — LIDOCAINE HCL (PF) 1 % IJ SOLN
30.0000 mL | INTRAMUSCULAR | Status: DC | PRN
  Administered 2011-10-13: 30 mL via SUBCUTANEOUS
  Filled 2011-10-13: qty 30

## 2011-10-13 MED ORDER — LIDOCAINE HCL (PF) 1 % IJ SOLN
INTRAMUSCULAR | Status: DC | PRN
  Administered 2011-10-13 (×2): 4 mL

## 2011-10-13 MED ORDER — FENTANYL 2.5 MCG/ML BUPIVACAINE 1/10 % EPIDURAL INFUSION (WH - ANES)
INTRAMUSCULAR | Status: DC | PRN
  Administered 2011-10-13: 14 mL/h via EPIDURAL

## 2011-10-13 MED ORDER — FLEET ENEMA 7-19 GM/118ML RE ENEM: 1.0000 | ENEMA | RECTAL | Status: DC | PRN

## 2011-10-13 MED ORDER — ACETAMINOPHEN 500 MG PO TABS
1000.0000 mg | ORAL_TABLET | Freq: Once | ORAL | Status: AC
  Administered 2011-10-13: 975 mg via ORAL

## 2011-10-13 MED ORDER — PHENYLEPHRINE 40 MCG/ML (10ML) SYRINGE FOR IV PUSH (FOR BLOOD PRESSURE SUPPORT): 80.0000 ug | PREFILLED_SYRINGE | INTRAVENOUS | Status: DC | PRN

## 2011-10-13 MED ORDER — OXYTOCIN 20 UNITS IN LACTATED RINGERS INFUSION - SIMPLE: 125.0000 mL/h | Freq: Once | INTRAVENOUS | Status: DC

## 2011-10-13 MED ORDER — LACTATED RINGERS IV SOLN
INTRAVENOUS | Status: DC
  Administered 2011-10-13: 18:00:00 via INTRAVENOUS

## 2011-10-13 MED ORDER — OXYCODONE-ACETAMINOPHEN 5-325 MG PO TABS: 1.0000 | ORAL_TABLET | ORAL | Status: DC | PRN

## 2011-10-13 MED ORDER — EPHEDRINE 5 MG/ML INJ: 10.0000 mg | INTRAVENOUS | Status: DC | PRN

## 2011-10-13 MED ORDER — LACTATED RINGERS IV SOLN: 500.0000 mL | Freq: Once | INTRAVENOUS | Status: DC

## 2011-10-13 MED ORDER — OXYTOCIN BOLUS FROM INFUSION
500.0000 mL | Freq: Once | INTRAVENOUS | Status: DC
  Filled 2011-10-13: qty 1000
  Filled 2011-10-13: qty 500

## 2011-10-13 MED ORDER — PHENYLEPHRINE 40 MCG/ML (10ML) SYRINGE FOR IV PUSH (FOR BLOOD PRESSURE SUPPORT)
80.0000 ug | PREFILLED_SYRINGE | INTRAVENOUS | Status: DC | PRN
  Filled 2011-10-13: qty 5

## 2011-10-13 MED ORDER — FENTANYL 2.5 MCG/ML BUPIVACAINE 1/10 % EPIDURAL INFUSION (WH - ANES)
14.0000 mL/h | INTRAMUSCULAR | Status: DC
  Filled 2011-10-13 (×2): qty 60

## 2011-10-13 MED ORDER — ACETAMINOPHEN 325 MG PO TABS
650.0000 mg | ORAL_TABLET | ORAL | Status: DC | PRN
  Filled 2011-10-13: qty 3

## 2011-10-13 MED ORDER — LACTATED RINGERS IV SOLN
500.0000 mL | INTRAVENOUS | Status: DC | PRN
  Administered 2011-10-13: 500 mL via INTRAVENOUS

## 2011-10-13 MED ORDER — ONDANSETRON HCL 4 MG/2ML IJ SOLN: 4.0000 mg | Freq: Four times a day (QID) | INTRAMUSCULAR | Status: DC | PRN

## 2011-10-13 MED ORDER — SODIUM CHLORIDE 0.9 % IV SOLN
2.0000 g | Freq: Once | INTRAVENOUS | Status: AC
  Administered 2011-10-13: 2 g via INTRAVENOUS
  Filled 2011-10-13: qty 2000

## 2011-10-13 MED ORDER — EPHEDRINE 5 MG/ML INJ
10.0000 mg | INTRAVENOUS | Status: DC | PRN
  Filled 2011-10-13: qty 4

## 2011-10-13 MED ORDER — IBUPROFEN 600 MG PO TABS
600.0000 mg | ORAL_TABLET | Freq: Four times a day (QID) | ORAL | Status: DC | PRN
  Administered 2011-10-13: 600 mg via ORAL
  Filled 2011-10-13: qty 1

## 2011-10-13 MED ORDER — DIPHENHYDRAMINE HCL 50 MG/ML IJ SOLN: 12.5000 mg | INTRAMUSCULAR | Status: DC | PRN

## 2011-10-13 NOTE — H&P (Signed)
36 y.o. [redacted]w[redacted]d  G3P3003 comes in c/o ctx.  Otherwise has good fetal movement and no bleeding.  Past Medical History  Diagnosis Date  . Pre-eclampsia, mild     with previous delivery  . Pregnancy induced hypertension   . Advanced maternal age in pregnancy   . History of chicken pox     Past Surgical History  Procedure Date  . No past surgeries     OB History    Grav Para Term Preterm Abortions TAB SAB Ect Mult Living   3 3 3  0 0 0 0 0 0 3     # Outc Date GA Lbr Len/2nd Wgt Sex Del Anes PTL Lv   1 TRM 2003 [redacted]w[redacted]d  7lb2oz(3.232kg) F SVD EPI  Yes   2 TRM 2011 [redacted]w[redacted]d  7lb4oz(3.289kg) M SVD EPI  Yes   3 TRM 3/13 [redacted]w[redacted]d -07:35 / 13:02 8lb5.7oz(3.79kg) M SVD EPI  Yes   Comments: Normal newborn      History   Social History  . Marital Status: Married    Spouse Name: N/A    Number of Children: N/A  . Years of Education: N/A   Occupational History  . Not on file.   Social History Main Topics  . Smoking status: Never Smoker   . Smokeless tobacco: Never Used  . Alcohol Use: No  . Drug Use: No  . Sexually Active: Yes    Birth Control/ Protection: None   Other Topics Concern  . Not on file   Social History Narrative  . No narrative on file   Review of patient's allergies indicates no known allergies.   Prenatal Course:  Uncomplicated, AMA, hx PIH last pregnancy  Filed Vitals:   10/13/11 2140  BP: 140/84  Pulse: 70  Temp:   Resp: 18     Lungs/Cor:  NAD Abdomen:  soft, gravid Ex:  no cords, erythema SVE:  5/90/0 FHTs:  130, good STV, NST R Toco:  q2-3   A/P   Admit to L&D for labor  GBS Pos  Epidural when labs back  Desires pptl  Desires circumcision  Philip Aspen

## 2011-10-13 NOTE — Progress Notes (Signed)
Mild and severe range BPs while in active labor.  Hx PIH, will continue to monitor.  If persistently severe pp will consider further tx.

## 2011-10-13 NOTE — Anesthesia Procedure Notes (Signed)
Epidural Patient location during procedure: OB Start time: 10/13/2011 6:38 PM  Staffing Anesthesiologist: Avram Danielson A. Performed by: anesthesiologist   Preanesthetic Checklist Completed: patient identified, site marked, surgical consent, pre-op evaluation, timeout performed, IV checked, risks and benefits discussed and monitors and equipment checked  Epidural Patient position: sitting Prep: site prepped and draped and DuraPrep Patient monitoring: continuous pulse ox and blood pressure Approach: midline Injection technique: LOR air  Needle:  Needle type: Tuohy  Needle gauge: 17 G Needle length: 9 cm Needle insertion depth: 5 cm cm Catheter type: closed end flexible Catheter size: 19 Gauge Catheter at skin depth: 10 cm Test dose: negative and Other  Assessment Events: blood not aspirated, injection not painful, no injection resistance, negative IV test and no paresthesia  Additional Notes Patient identified. Risks and benefits discussed including failed block, incomplete  Pain control, post dural puncture headache, nerve damage, paralysis, blood pressure Changes, nausea, vomiting, reactions to medications-both toxic and allergic and post Partum back pain. All questions were answered. Patient expressed understanding and wished to proceed. Sterile technique was used throughout procedure. Epidural site was Dressed with sterile barrier dressing. No paresthesias, signs of intravascular injection Or signs of intrathecal spread were encountered. Attempt x 3 due to poor position and patient movement. Patient was more comfortable after the epidural was dosed. Please see RN's note for documentation of vital signs and FHR which are stable.

## 2011-10-13 NOTE — Progress Notes (Signed)
Got a call from MAU regarding pt in active labor. Received orders to admit, GBS protocal, ok for epidural

## 2011-10-13 NOTE — Anesthesia Preprocedure Evaluation (Signed)
Anesthesia Evaluation  Patient identified by MRN, date of birth, ID band Patient awake    Reviewed: Allergy & Precautions, H&P , Patient's Chart, lab work & pertinent test results  Airway Mallampati: III TM Distance: >3 FB Neck ROM: full    Dental No notable dental hx. (+) Teeth Intact   Pulmonary neg pulmonary ROS,  breath sounds clear to auscultation  Pulmonary exam normal       Cardiovascular hypertension, negative cardio ROS  Rhythm:regular Rate:Normal     Neuro/Psych negative neurological ROS  negative psych ROS   GI/Hepatic negative GI ROS, Neg liver ROS,   Endo/Other  negative endocrine ROS  Renal/GU negative Renal ROS  negative genitourinary   Musculoskeletal   Abdominal Normal abdominal exam  (+)   Peds  Hematology negative hematology ROS (+)   Anesthesia Other Findings   Reproductive/Obstetrics (+) Pregnancy                           Anesthesia Physical Anesthesia Plan  ASA: II  Anesthesia Plan: Epidural   Post-op Pain Management:    Induction:   Airway Management Planned:   Additional Equipment:   Intra-op Plan:   Post-operative Plan:   Informed Consent: I have reviewed the patients History and Physical, chart, labs and discussed the procedure including the risks, benefits and alternatives for the proposed anesthesia with the patient or authorized representative who has indicated his/her understanding and acceptance.     Plan Discussed with: Anesthesiologist and Surgeon  Anesthesia Plan Comments:         Anesthesia Quick Evaluation

## 2011-10-14 ENCOUNTER — Encounter (HOSPITAL_COMMUNITY): Payer: Self-pay | Admitting: Anesthesiology

## 2011-10-14 ENCOUNTER — Inpatient Hospital Stay (HOSPITAL_COMMUNITY): Payer: 59 | Admitting: Anesthesiology

## 2011-10-14 ENCOUNTER — Encounter (HOSPITAL_COMMUNITY)
Admission: AD | Disposition: A | Discharge status: Home / Self Care | Payer: Self-pay | Source: Ambulatory Visit | Attending: Obstetrics and Gynecology

## 2011-10-14 HISTORY — PX: TUBAL LIGATION: SHX77

## 2011-10-14 LAB — CBC
Hemoglobin: 9.7 g/dL — ABNORMAL LOW (ref 12.0–15.0)
MCH: 28.4 pg (ref 26.0–34.0)
MCHC: 33.4 g/dL (ref 30.0–36.0)
MCV: 84.8 fL (ref 78.0–100.0)
RBC: 3.42 MIL/uL — ABNORMAL LOW (ref 3.87–5.11)

## 2011-10-14 LAB — RPR: RPR Ser Ql: NONREACTIVE

## 2011-10-14 SURGERY — LIGATION, FALLOPIAN TUBE, POSTPARTUM
Anesthesia: Spinal | Site: Abdomen | Laterality: Bilateral | Wound class: Clean

## 2011-10-14 MED ORDER — ONDANSETRON HCL 4 MG/2ML IJ SOLN
INTRAMUSCULAR | Status: AC
  Filled 2011-10-14: qty 2

## 2011-10-14 MED ORDER — LACTATED RINGERS IV SOLN
INTRAVENOUS | Status: DC
  Administered 2011-10-14: 12:00:00 via INTRAVENOUS

## 2011-10-14 MED ORDER — METOCLOPRAMIDE HCL 10 MG PO TABS
10.0000 mg | ORAL_TABLET | Freq: Once | ORAL | Status: AC
  Administered 2011-10-14: 10 mg via ORAL
  Filled 2011-10-14: qty 1

## 2011-10-14 MED ORDER — BENZOCAINE-MENTHOL 20-0.5 % EX AERO
1.0000 "application " | INHALATION_SPRAY | CUTANEOUS | Status: DC | PRN
  Administered 2011-10-14: 1 via TOPICAL

## 2011-10-14 MED ORDER — ZOLPIDEM TARTRATE 5 MG PO TABS: 5.0000 mg | ORAL_TABLET | Freq: Every evening | ORAL | Status: DC | PRN

## 2011-10-14 MED ORDER — FENTANYL CITRATE 0.05 MG/ML IJ SOLN
INTRAMUSCULAR | Status: DC | PRN
  Administered 2011-10-14 (×2): 50 ug via INTRAVENOUS

## 2011-10-14 MED ORDER — ONDANSETRON HCL 4 MG PO TABS: 4.0000 mg | ORAL_TABLET | ORAL | Status: DC | PRN

## 2011-10-14 MED ORDER — PRENATAL MULTIVITAMIN CH
1.0000 | ORAL_TABLET | Freq: Every day | ORAL | Status: DC
  Administered 2011-10-15: 1 via ORAL
  Filled 2011-10-14: qty 1

## 2011-10-14 MED ORDER — ONDANSETRON HCL 4 MG/2ML IJ SOLN
INTRAMUSCULAR | Status: DC | PRN
  Administered 2011-10-14: 4 mg via INTRAVENOUS

## 2011-10-14 MED ORDER — DIBUCAINE 1 % RE OINT
1.0000 "application " | TOPICAL_OINTMENT | RECTAL | Status: DC | PRN
  Administered 2011-10-14: 1 via RECTAL
  Filled 2011-10-14: qty 28

## 2011-10-14 MED ORDER — TETANUS-DIPHTH-ACELL PERTUSSIS 5-2.5-18.5 LF-MCG/0.5 IM SUSP
0.5000 mL | Freq: Once | INTRAMUSCULAR | Status: AC
  Administered 2011-10-15: 0.5 mL via INTRAMUSCULAR
  Filled 2011-10-14: qty 0.5

## 2011-10-14 MED ORDER — SIMETHICONE 80 MG PO CHEW: 80.0000 mg | CHEWABLE_TABLET | ORAL | Status: DC | PRN

## 2011-10-14 MED ORDER — LACTATED RINGERS IV SOLN
INTRAVENOUS | Status: DC
  Administered 2011-10-14: 15:00:00 via INTRAVENOUS
  Administered 2011-10-14: 125 mL/h via INTRAVENOUS

## 2011-10-14 MED ORDER — FENTANYL CITRATE 0.05 MG/ML IJ SOLN
INTRAMUSCULAR | Status: AC
  Filled 2011-10-14: qty 2

## 2011-10-14 MED ORDER — WITCH HAZEL-GLYCERIN EX PADS: 1.0000 "application " | MEDICATED_PAD | CUTANEOUS | Status: DC | PRN

## 2011-10-14 MED ORDER — OXYCODONE-ACETAMINOPHEN 5-325 MG PO TABS
1.0000 | ORAL_TABLET | ORAL | Status: DC | PRN
  Administered 2011-10-14 – 2011-10-15 (×6): 2 via ORAL
  Filled 2011-10-14 (×6): qty 2

## 2011-10-14 MED ORDER — BENZOCAINE-MENTHOL 20-0.5 % EX AERO
INHALATION_SPRAY | CUTANEOUS | Status: AC
  Administered 2011-10-14: 1 via TOPICAL
  Filled 2011-10-14: qty 56

## 2011-10-14 MED ORDER — DIPHENHYDRAMINE HCL 25 MG PO CAPS: 25.0000 mg | ORAL_CAPSULE | Freq: Four times a day (QID) | ORAL | Status: DC | PRN

## 2011-10-14 MED ORDER — LACTATED RINGERS IV BOLUS (SEPSIS): 500.0000 mL | Freq: Once | INTRAVENOUS | Status: DC

## 2011-10-14 MED ORDER — SENNOSIDES-DOCUSATE SODIUM 8.6-50 MG PO TABS
2.0000 | ORAL_TABLET | Freq: Every day | ORAL | Status: DC
  Administered 2011-10-14: 2 via ORAL

## 2011-10-14 MED ORDER — LANOLIN HYDROUS EX OINT: TOPICAL_OINTMENT | CUTANEOUS | Status: DC | PRN

## 2011-10-14 MED ORDER — MEPERIDINE HCL 25 MG/ML IJ SOLN: 6.2500 mg | INTRAMUSCULAR | Status: DC | PRN

## 2011-10-14 MED ORDER — PRENATAL MULTIVITAMIN CH: 1.0000 | ORAL_TABLET | Freq: Every morning | ORAL | Status: DC

## 2011-10-14 MED ORDER — METOCLOPRAMIDE HCL 5 MG/ML IJ SOLN: 10.0000 mg | Freq: Once | INTRAMUSCULAR | Status: AC | PRN

## 2011-10-14 MED ORDER — FAMOTIDINE 20 MG PO TABS
40.0000 mg | ORAL_TABLET | Freq: Once | ORAL | Status: AC
  Administered 2011-10-14: 40 mg via ORAL
  Filled 2011-10-14: qty 2

## 2011-10-14 MED ORDER — MIDAZOLAM HCL 5 MG/5ML IJ SOLN
INTRAMUSCULAR | Status: DC | PRN
  Administered 2011-10-14: 1 mg via INTRAVENOUS

## 2011-10-14 MED ORDER — MIDAZOLAM HCL 2 MG/2ML IJ SOLN
INTRAMUSCULAR | Status: AC
  Filled 2011-10-14: qty 2

## 2011-10-14 MED ORDER — ONDANSETRON HCL 4 MG/2ML IJ SOLN: 4.0000 mg | INTRAMUSCULAR | Status: DC | PRN

## 2011-10-14 MED ORDER — FENTANYL CITRATE 0.05 MG/ML IJ SOLN: 25.0000 ug | INTRAMUSCULAR | Status: DC | PRN

## 2011-10-14 SURGICAL SUPPLY — 18 items
CLIP FILSHIE TUBAL LIGA STRL (Clip) ×2 IMPLANT
CLOTH BEACON ORANGE TIMEOUT ST (SAFETY) ×2 IMPLANT
CONTAINER PREFILL 10% NBF 15ML (MISCELLANEOUS) IMPLANT
DERMABOND ADVANCED (GAUZE/BANDAGES/DRESSINGS) ×1
DERMABOND ADVANCED .7 DNX12 (GAUZE/BANDAGES/DRESSINGS) ×1 IMPLANT
GLOVE ECLIPSE 6.0 STRL STRAW (GLOVE) ×2 IMPLANT
GLOVE ECLIPSE 6.5 STRL STRAW (GLOVE) ×2 IMPLANT
GOWN PREVENTION PLUS LG XLONG (DISPOSABLE) ×4 IMPLANT
NS IRRIG 1000ML POUR BTL (IV SOLUTION) ×2 IMPLANT
PACK ABDOMINAL MINOR (CUSTOM PROCEDURE TRAY) ×2 IMPLANT
SPONGE LAP 4X18 X RAY DECT (DISPOSABLE) ×2 IMPLANT
SUT VIC AB 3-0 SH 27 (SUTURE)
SUT VIC AB 3-0 SH 27X BRD (SUTURE) IMPLANT
SUT VICRYL 0 UR6 27IN ABS (SUTURE) ×2 IMPLANT
SUT VICRYL 4-0 PS2 18IN ABS (SUTURE) ×2 IMPLANT
TOWEL OR 17X24 6PK STRL BLUE (TOWEL DISPOSABLE) ×4 IMPLANT
TRAY FOLEY CATH 14FR (SET/KITS/TRAYS/PACK) ×2 IMPLANT
WATER STERILE IRR 1000ML POUR (IV SOLUTION) IMPLANT

## 2011-10-14 NOTE — Anesthesia Preprocedure Evaluation (Signed)
Anesthesia Evaluation  Patient identified by MRN, date of birth, ID band Patient awake    Reviewed: Allergy & Precautions, H&P , NPO status , Patient's Chart, lab work & pertinent test results  Airway Mallampati: III TM Distance: >3 FB Neck ROM: full    Dental No notable dental hx. (+) Teeth Intact   Pulmonary neg pulmonary ROS,  breath sounds clear to auscultation  Pulmonary exam normal       Cardiovascular hypertension, negative cardio ROS  Rhythm:regular Rate:Normal     Neuro/Psych negative neurological ROS  negative psych ROS   GI/Hepatic negative GI ROS, Neg liver ROS,   Endo/Other  negative endocrine ROS  Renal/GU negative Renal ROS  negative genitourinary   Musculoskeletal   Abdominal Normal abdominal exam  (+)   Peds  Hematology negative hematology ROS (+)   Anesthesia Other Findings   Reproductive/Obstetrics negative OB ROS                           Anesthesia Physical  Anesthesia Plan  ASA: II  Anesthesia Plan: Spinal   Post-op Pain Management:    Induction:   Airway Management Planned:   Additional Equipment:   Intra-op Plan:   Post-operative Plan:   Informed Consent: I have reviewed the patients History and Physical, chart, labs and discussed the procedure including the risks, benefits and alternatives for the proposed anesthesia with the patient or authorized representative who has indicated his/her understanding and acceptance.     Plan Discussed with: Anesthesiologist, Surgeon and CRNA  Anesthesia Plan Comments:         Anesthesia Quick Evaluation

## 2011-10-14 NOTE — Progress Notes (Deleted)
UR chart review completed.  

## 2011-10-14 NOTE — Progress Notes (Signed)
Patient C/O of worsening headache, Dr. Claiborne Billings instructed to give 1000 Tylenol and keep on unit until headache improved and patient does not see spots. Medicated, placed in left lateral with lights dim. Instructed visitors to remain quiet and let patient rest, decrease stimuli.

## 2011-10-14 NOTE — Anesthesia Postprocedure Evaluation (Signed)
  Anesthesia Post-op Note  Patient: Debra Montgomery  Procedure(s) Performed: * No procedures listed *  Patient Location: Mother/Baby  Anesthesia Type: Epidural  Level of Consciousness: awake  Airway and Oxygen Therapy: Patient Spontanous Breathing  Post-op Pain: none  Post-op Assessment: Patient's Cardiovascular Status Stable, Respiratory Function Stable, Patent Airway, No signs of Nausea or vomiting, Adequate PO intake, Pain level controlled, No headache, No backache, No residual numbness and No residual motor weakness  Post-op Vital Signs: Reviewed and stable  Complications: No apparent anesthesia complications

## 2011-10-14 NOTE — Transfer of Care (Signed)
Immediate Anesthesia Transfer of Care Note  Patient: Debra Montgomery  Procedure(s) Performed: Procedure(s) (LRB): POST PARTUM TUBAL LIGATION (Bilateral)  Patient Location: PACU  Anesthesia Type: Spinal  Level of Consciousness: awake, alert  and oriented  Airway & Oxygen Therapy: Patient Spontanous Breathing  Post-op Assessment: Report given to PACU RN and Post -op Vital signs reviewed and stable  Post vital signs: Reviewed and stable  Complications: No apparent anesthesia complications

## 2011-10-14 NOTE — Anesthesia Postprocedure Evaluation (Signed)
  Anesthesia Post-op Note  Patient: Debra Montgomery  Procedure(s) Performed: Procedure(s) (LRB): POST PARTUM TUBAL LIGATION (Bilateral)  Patient Location: PACU  Anesthesia Type: Spinal  Level of Consciousness: awake, alert  and oriented  Airway and Oxygen Therapy: Patient Spontanous Breathing  Post-op Pain: mild  Post-op Assessment: Post-op Vital signs reviewed, Patient's Cardiovascular Status Stable, Respiratory Function Stable, Patent Airway, No signs of Nausea or vomiting, Pain level controlled, No headache and No backache  Post-op Vital Signs: Reviewed and stable  Complications: No apparent anesthesia complications

## 2011-10-14 NOTE — Progress Notes (Signed)
Post Partum Day 1 Subjective: mild headache, worse last night after epidural  Objective: Blood pressure 114/70, pulse 80, temperature 98.1 F (36.7 C), temperature source Oral, resp. rate 18, height 5\' 5"  (1.651 m), weight 175 lb (79.379 kg), SpO2 98.00%, unknown if currently breastfeeding.  Physical Exam:  General: alert Lochia: appropriate Uterine Fundus: firm   Basename 10/14/11 0540 10/13/11 1750  HGB 9.7* 12.3  HCT 29.0* 36.8    Assessment/Plan: Patient requests post partum tubal sterilization.  Has been NPO.  Epidural catheter has been removed.  Will proceed with BTL later this morning.   LOS: 1 day   Mickel Baas 10/14/2011, 9:36 AM

## 2011-10-14 NOTE — Anesthesia Procedure Notes (Signed)
Spinal  Patient location during procedure: OR Start time: 10/14/2011 12:02 PM Staffing Anesthesiologist: Sofya Moustafa A. Performed by: anesthesiologist  Preanesthetic Checklist Completed: patient identified, site marked, surgical consent, pre-op evaluation, timeout performed, IV checked, risks and benefits discussed and monitors and equipment checked Spinal Block Patient position: sitting Prep: site prepped and draped and DuraPrep Patient monitoring: heart rate, cardiac monitor, continuous pulse ox and blood pressure Approach: midline Location: L3-4 Injection technique: single-shot Needle Needle type: Sprotte  Needle gauge: 24 G Needle length: 9 cm Needle insertion depth: 5 cm Assessment Sensory level: T4 Additional Notes Patient tolerated procedure well. Adequate sensory level.

## 2011-10-14 NOTE — Op Note (Signed)
Mickel Baas Physician Signed Obstetrics Op Note 04/30/2011 1:37 PM     Patient Name: Debra Montgomery  MRN: [id number]  Date of Surgery: 10/14/2011    PREOPERATIVE DIAGNOSIS: desires sterilzation  POSTOPERATIVE DIAGNOSIS: desires sterilzation   PROCEDURE: POST PARTUM TUBAL STERILIZATION  SURGEON: Jamonta Goerner D. Arlyce Dice M.D.  ANESTHESIA: Spinal  ESTIMATED BLOOD LOSS: Minimal  FINDINGS: normal fallopian tubes   INDICATIONS: voluntary sterilization  PROCEDURE IN DETAIL: The abdomen was prepped and draped in sterile fashion and the bladder was emptied. A 3 cm subumbilical incision was made and carried down to the peritoneal cavity. The right fallopian tube was grasped with a Babcock clamp and traced to the fimbriated end. The isthmic-ampullary junction was elevated and a Filshie Clip was applied to occlude the tube. The identical procedure was carried out on the left fallopian tube. The fascia was closed with running 0-Vicryl suture. The skin was closed with 4-0 vicryl in a subcuticular fashion. The procedure was then terminated and the patient left the operating room in good condition.

## 2011-10-15 MED ORDER — OXYCODONE-ACETAMINOPHEN 5-325 MG PO TABS: 1.0000 | ORAL_TABLET | ORAL | Status: AC | PRN

## 2011-10-15 NOTE — Addendum Note (Signed)
Addendum  created 10/15/11 1030 by Suella Grove, CRNA   Modules edited:Charges VN, Notes Section

## 2011-10-15 NOTE — Anesthesia Postprocedure Evaluation (Signed)
  Anesthesia Post-op Note  Patient: Debra Montgomery  Procedure(s) Performed: Procedure(s) (LRB): POST PARTUM TUBAL LIGATION (Bilateral)  Patient Location: Mother/Baby  Anesthesia Type: Epidural  Level of Consciousness: awake  Airway and Oxygen Therapy: Patient Spontanous Breathing  Post-op Pain: none  Post-op Assessment: Patient's Cardiovascular Status Stable, Respiratory Function Stable, Patent Airway, No signs of Nausea or vomiting, Adequate PO intake, Pain level controlled, No headache, No backache, No residual numbness and No residual motor weakness  Post-op Vital Signs: Reviewed and stable  Complications: No apparent anesthesia complications

## 2011-10-15 NOTE — Discharge Summary (Signed)
Obstetric Discharge Summary Reason for Admission: onset of labor Prenatal Procedures: none Intrapartum Procedures: spontaneous vaginal delivery Postpartum Procedures: P.P. tubal ligation Complications-Operative and Postpartum: none Hemoglobin  Date Value Range Status  10/14/2011 9.7* 12.0-15.0 (g/dL) Final     REPEATED TO VERIFY     DELTA CHECK NOTED     HCT  Date Value Range Status  10/14/2011 29.0* 36.0-46.0 (%) Final    Physical Exam:  General: alert and cooperative Lochia: appropriate Uterine Fundus: firm Incision: healing well DVT Evaluation: No evidence of DVT seen on physical exam.  Discharge Diagnoses: Term Pregnancy-delivered  Discharge Information: Date: 10/15/2011 Activity: pelvic rest Diet: routine Medications: PNV, Ibuprofen, Colace and Iron Condition: stable Instructions: refer to practice specific booklet Discharge to: home Follow-up Information    Follow up with Debra Carreras, DO in 4 weeks.   Contact information:   [address] [facility] [place] [address] [phone]          Newborn Data: Live born female  Birth Weight: 8 lb 5.7 oz (3790 g) APGAR: 9, 9  Home with mother.  Debra Montgomery 10/15/2011, 10:02 AM

## 2011-10-16 ENCOUNTER — Encounter (HOSPITAL_COMMUNITY): Payer: Self-pay | Admitting: Obstetrics & Gynecology

## 2013-08-01 ENCOUNTER — Emergency Department (HOSPITAL_COMMUNITY)
Admission: EM | Admit: 2013-08-01 | Discharge: 2013-08-01 | Disposition: A | Discharge status: Home / Self Care | Payer: Self-pay | Attending: Emergency Medicine | Admitting: Emergency Medicine

## 2013-08-01 ENCOUNTER — Emergency Department (HOSPITAL_COMMUNITY): Payer: Self-pay

## 2013-08-01 ENCOUNTER — Encounter (HOSPITAL_COMMUNITY): Payer: Self-pay | Admitting: Emergency Medicine

## 2013-08-01 DIAGNOSIS — R05 Cough: Secondary | ICD-10-CM

## 2013-08-01 DIAGNOSIS — J111 Influenza due to unidentified influenza virus with other respiratory manifestations: Secondary | ICD-10-CM | POA: Insufficient documentation

## 2013-08-01 DIAGNOSIS — R059 Cough, unspecified: Secondary | ICD-10-CM

## 2013-08-01 DIAGNOSIS — R5383 Other fatigue: Secondary | ICD-10-CM

## 2013-08-01 DIAGNOSIS — R5381 Other malaise: Secondary | ICD-10-CM | POA: Insufficient documentation

## 2013-08-01 DIAGNOSIS — R6889 Other general symptoms and signs: Secondary | ICD-10-CM

## 2013-08-01 DIAGNOSIS — Z8619 Personal history of other infectious and parasitic diseases: Secondary | ICD-10-CM | POA: Insufficient documentation

## 2013-08-01 DIAGNOSIS — Z79899 Other long term (current) drug therapy: Secondary | ICD-10-CM | POA: Insufficient documentation

## 2013-08-01 LAB — CBC WITH DIFFERENTIAL/PLATELET
Basophils Absolute: 0 10*3/uL (ref 0.0–0.1)
Basophils Relative: 0 % (ref 0–1)
EOS ABS: 0 10*3/uL (ref 0.0–0.7)
EOS PCT: 0 % (ref 0–5)
HCT: 36.7 % (ref 36.0–46.0)
HEMOGLOBIN: 12.1 g/dL (ref 12.0–15.0)
LYMPHS ABS: 0.9 10*3/uL (ref 0.7–4.0)
LYMPHS PCT: 12 % (ref 12–46)
MCH: 27.1 pg (ref 26.0–34.0)
MCHC: 33 g/dL (ref 30.0–36.0)
MCV: 82.1 fL (ref 78.0–100.0)
MONOS PCT: 6 % (ref 3–12)
Monocytes Absolute: 0.5 10*3/uL (ref 0.1–1.0)
Neutro Abs: 6.1 10*3/uL (ref 1.7–7.7)
Neutrophils Relative %: 81 % — ABNORMAL HIGH (ref 43–77)
Platelets: 274 10*3/uL (ref 150–400)
RBC: 4.47 MIL/uL (ref 3.87–5.11)
RDW: 14.8 % (ref 11.5–15.5)
WBC: 7.5 10*3/uL (ref 4.0–10.5)

## 2013-08-01 LAB — POCT I-STAT, CHEM 8
BUN: 5 mg/dL — AB (ref 6–23)
CREATININE: 0.6 mg/dL (ref 0.50–1.10)
Calcium, Ion: 1.11 mmol/L — ABNORMAL LOW (ref 1.12–1.23)
Chloride: 102 mEq/L (ref 96–112)
GLUCOSE: 113 mg/dL — AB (ref 70–99)
HCT: 40 % (ref 36.0–46.0)
HEMOGLOBIN: 13.6 g/dL (ref 12.0–15.0)
POTASSIUM: 3.5 meq/L — AB (ref 3.7–5.3)
Sodium: 139 mEq/L (ref 137–147)
TCO2: 22 mmol/L (ref 0–100)

## 2013-08-01 LAB — URINALYSIS W MICROSCOPIC + REFLEX CULTURE
BILIRUBIN URINE: NEGATIVE
Glucose, UA: NEGATIVE mg/dL
HGB URINE DIPSTICK: NEGATIVE
KETONES UR: NEGATIVE mg/dL
Leukocytes, UA: NEGATIVE
Nitrite: NEGATIVE
PROTEIN: NEGATIVE mg/dL
Specific Gravity, Urine: 1.025 (ref 1.005–1.030)
UROBILINOGEN UA: 1 mg/dL (ref 0.0–1.0)
pH: 6 (ref 5.0–8.0)

## 2013-08-01 MED ORDER — SODIUM CHLORIDE 0.9 % IV BOLUS (SEPSIS)
1000.0000 mL | Freq: Once | INTRAVENOUS | Status: AC
  Administered 2013-08-01: 1000 mL via INTRAVENOUS

## 2013-08-01 MED ORDER — HYDROCOD POLST-CHLORPHEN POLST 10-8 MG/5ML PO LQCR
5.0000 mL | Freq: Once | ORAL | Status: AC
  Administered 2013-08-01: 5 mL via ORAL
  Filled 2013-08-01: qty 5

## 2013-08-01 MED ORDER — ACETAMINOPHEN 500 MG PO TABS
1000.0000 mg | ORAL_TABLET | Freq: Once | ORAL | Status: AC
  Administered 2013-08-01: 1000 mg via ORAL
  Filled 2013-08-01: qty 2

## 2013-08-01 MED ORDER — HYDROCODONE-HOMATROPINE 5-1.5 MG/5ML PO SYRP: 5.0000 mL | ORAL_SOLUTION | Freq: Four times a day (QID) | ORAL | Status: AC | PRN

## 2013-08-01 NOTE — Discharge Instructions (Signed)
Take tylenol or motrin for fever and pain. Continue robitussin for cough. Rest. Drink plenty of fluids. Take hycodan for cough and pain. Do not take if driving or going to work, it may make you drowsy. Follow up with your doctor in 2-3 days if not improving.   Influenza A (H1N1) H1N1 formerly called "swine flu" is a new influenza virus causing sickness in people. The H1N1 virus is different from seasonal influenza viruses. However, the H1N1 symptoms are similar to seasonal influenza and it is spread from person to person. You may be at higher risk for serious problems if you have underlying serious medical conditions. The CDC and the [employer] are following reported cases around the world. CAUSES   The flu is thought to spread mainly person-to-person through coughing or sneezing of infected people.  A person may become infected by touching something with the virus on it and then touching their mouth or nose. SYMPTOMS   Fever.  Headache.  Tiredness.  Cough.  Sore throat.  Runny or stuffy nose.  Body aches.  Diarrhea and vomiting These symptoms are referred to as "flu-like symptoms." A lot of different illnesses, including the common cold, may have similar symptoms. DIAGNOSIS   There are tests that can tell if you have the H1N1 virus.  Confirmed cases of H1N1 will be reported to the state or local health department.  A doctor's exam may be needed to tell whether you have an infection that is a complication of the flu. HOME CARE INSTRUCTIONS   Stay informed. Visit the [facility] website for current recommendations. Visit [employer]tnwww.cdc.gov/H1N1flu/. You may also call 1-800-CDC-INFO ((909)645-11091-414-308-8059).  Get help early if you develop any of the above symptoms.  If you are at high risk from complications of the flu, talk to your caregiver as soon as you develop flu-like symptoms. Those at higher risk for complications include:  People 65 years or older.  People with chronic  medical conditions.  Pregnant women.  Young children.  Your caregiver may recommend antiviral medicine to help treat the flu.  If you get the flu, get plenty of rest, drink enough water and fluids to keep your urine clear or pale yellow, and avoid using alcohol or tobacco.  You may take over-the-counter medicine to relieve the symptoms of the flu if your caregiver approves. (Never give aspirin to children or teenagers who have flu-like symptoms, particularly fever). TREATMENT  If you do get sick, antiviral drugs are available. These drugs can make your illness milder and make you feel better faster. Treatment should start soon after illness starts. It is only effective if taken within the first day of becoming ill. Only your caregiver can prescribe antiviral medication.  PREVENTION   Cover your nose and mouth with a tissue or your arm when you cough or sneeze. Throw the tissue away.  Wash your hands often with soap and warm water, especially after you cough or sneeze. Alcohol-based cleaners are also effective against germs.  Avoid touching your eyes, nose or mouth. This is one way germs spread.  Try to avoid contact with sick people. Follow public health advice regarding school closures. Avoid crowds.  Stay home if you get sick. Limit contact with others to keep from infecting them. People infected with the H1N1 virus may be able to infect others anywhere from 1 day before feeling sick to 5-7 days after getting flu symptoms.  An H1N1 vaccine is available to help protect against the virus. In addition to the  H1N1 vaccine, you will need to be vaccinated for seasonal influenza. The H1N1 and seasonal vaccines may be given on the same day. The CDC especially recommends the H1N1 vaccine for:  Pregnant women.  People who live with or care for children younger than 43 months of age.  Health care and emergency services personnel.  Persons between the ages of 3 months through 21 years of  age.  People from ages 22 through 70 years who are at higher risk for H1N1 because of chronic health disorders or immune system problems. FACEMASKS In community and home settings, the use of facemasks and N95 respirators are not normally recommended. In certain circumstances, a facemask or N95 respirator may be used for persons at increased risk of severe illness from influenza. Your caregiver can give additional recommendations for facemask use. IN CHILDREN, EMERGENCY WARNING SIGNS THAT NEED URGENT MEDICAL CARE:  Fast breathing or trouble breathing.  Bluish skin color.  Not drinking enough fluids.  Not waking up or not interacting normally.  Being so fussy that the child does not want to be held.  Your child has an oral temperature above 102 F (38.9 C), not controlled by medicine.  Your baby is older than 3 months with a rectal temperature of 102 F (38.9 C) or higher.  Your baby is 10 months old or younger with a rectal temperature of 100.4 F (38 C) or higher.  Flu-like symptoms improve but then return with fever and worse cough. IN ADULTS, EMERGENCY WARNING SIGNS THAT NEED URGENT MEDICAL CARE:  Difficulty breathing or shortness of breath.  Pain or pressure in the chest or abdomen.  Sudden dizziness.  Confusion.  Severe or persistent vomiting.  Bluish color.  You have a oral temperature above 102 F (38.9 C), not controlled by medicine.  Flu-like symptoms improve but return with fever and worse cough. SEEK IMMEDIATE MEDICAL CARE IF:  You or someone you know is experiencing any of the above symptoms. When you arrive at the emergency center, report that you think you have the flu. You may be asked to wear a mask and/or sit in a secluded area to protect others from getting sick. MAKE SURE YOU:   Understand these instructions.  Will watch your condition.  Will get help right away if you are not doing well or get worse. Some of this information courtesy of the CDC.   Document Released: 01/01/2008 Document Revised: 10/07/2011 Document Reviewed: 01/01/2008 [facility] Patient Information 2014 [place], [place].  Acute Bronchitis Bronchitis is inflammation of the airways that extend from the windpipe into the lungs (bronchi). The inflammation often causes mucus to develop. This leads to a cough, which is the most common symptom of bronchitis.  In acute bronchitis, the condition usually develops suddenly and goes away over time, usually in a couple weeks. Smoking, allergies, and asthma can make bronchitis worse. Repeated episodes of bronchitis may cause further lung problems.  CAUSES Acute bronchitis is most often caused by the same virus that causes a cold. The virus can spread from person to person (contagious).  SIGNS AND SYMPTOMS   Cough.   Fever.   Coughing up mucus.   Body aches.   Chest congestion.   Chills.   Shortness of breath.   Sore throat.  DIAGNOSIS  Acute bronchitis is usually diagnosed through a physical exam. Tests, such as chest X-rays, are sometimes done to rule out other conditions.  TREATMENT  Acute bronchitis usually goes away in a couple weeks. Often times, no medical  treatment is necessary. Medicines are sometimes given for relief of fever or cough. Antibiotics are usually not needed but may be prescribed in certain situations. In some cases, an inhaler may be recommended to help reduce shortness of breath and control the cough. A cool mist vaporizer may also be used to help thin bronchial secretions and make it easier to clear the chest.  HOME CARE INSTRUCTIONS  Get plenty of rest.   Drink enough fluids to keep your urine clear or pale yellow (unless you have a medical condition that requires fluid restriction). Increasing fluids may help thin your secretions and will prevent dehydration.   Only take over-the-counter or prescription medicines as directed by your health care provider.   Avoid smoking and secondhand  smoke. Exposure to cigarette smoke or irritating chemicals will make bronchitis worse. If you are a smoker, consider using nicotine gum or skin patches to help control withdrawal symptoms. Quitting smoking will help your lungs heal faster.   Reduce the chances of another bout of acute bronchitis by washing your hands frequently, avoiding people with cold symptoms, and trying not to touch your hands to your mouth, nose, or eyes.   Follow up with your health care provider as directed.  SEEK MEDICAL CARE IF: Your symptoms do not improve after 1 week of treatment.  SEEK IMMEDIATE MEDICAL CARE IF:  You develop an increased fever or chills.   You have chest pain.   You have severe shortness of breath.  You have bloody sputum.   You develop dehydration.  You develop fainting.  You develop repeated vomiting.  You develop a severe headache. MAKE SURE YOU:   Understand these instructions.  Will watch your condition.  Will get help right away if you are not doing well or get worse. Document Released: 08/22/2004 Document Revised: 03/17/2013 Document Reviewed: 01/05/2013 [facility] Patient Information 2014 [place], [place].

## 2013-08-01 NOTE — ED Provider Notes (Signed)
CSN: [id number]     Arrival date & time 08/01/13  1751 History   First MD Initiated Contact with Patient 08/01/13 1804     Chief Complaint  Patient presents with  . Cough  . Fatigue   (Consider location/radiation/quality/duration/timing/severity/associated sxs/prior Treatment) HPI Debra Montgomery is a 38 y.o. female who presents to emergency department complaining of headache, cough, fever, fatigue. He should states that both of her young sons are sick with same symptoms. She denies any nasal congestion or sore throat. She's been taking Tylenol flu and cold as well as OB with no relief. She went to her doctor today and was sent here for IV fluids and testing. Patient did not get her flu shot this year. She denies any nausea, vomiting, diarrhea. She denies any neck pain or stiffness. She states her headache is mainly pressure around her face and for head. Patient reports onset of symptoms yesterday.  Past Medical History  Diagnosis Date  . Pre-eclampsia, mild     with previous delivery  . Pregnancy induced hypertension   . Advanced maternal age in pregnancy   . History of chicken pox    Past Surgical History  Procedure Laterality Date  . No past surgeries    . Tubal ligation  10/14/2011    Procedure: POST PARTUM TUBAL LIGATION;  Surgeon: Mickel Baasichard D Kaplan, MD;  Location: [facility];  Service: Gynecology;  Laterality: Bilateral;   Family History  Problem Relation Age of Onset  . Anesthesia problems Neg Hx   . Cancer Mother     lymphoma   History  Substance Use Topics  . Smoking status: Never Smoker   . Smokeless tobacco: Never Used  . Alcohol Use: No   OB History   Grav Para Term Preterm Abortions TAB SAB Ect Mult Living   3 3 3  0 0 0 0 0 0 3     Review of Systems  Constitutional: Positive for fever, chills and fatigue.  Respiratory: Positive for cough and chest tightness. Negative for shortness of breath.   Cardiovascular: Negative for chest pain, palpitations and leg  swelling.  Gastrointestinal: Negative for nausea, vomiting, abdominal pain and diarrhea.  Genitourinary: Negative for dysuria, flank pain, vaginal bleeding, vaginal discharge, vaginal pain and pelvic pain.  Musculoskeletal: Positive for myalgias. Negative for back pain, neck pain and neck stiffness.  Skin: Negative for rash.  Neurological: Positive for headaches. Negative for dizziness, weakness and light-headedness.  All other systems reviewed and are negative.    Allergies  Review of patient's allergies indicates no known allergies.  Home Medications   Current Outpatient Rx  Name  Route  Sig  Dispense  Refill  . acetaminophen (TYLENOL) 500 MG tablet   Oral   Take 500 mg by mouth every 6 (six) hours as needed. Takes for headaches         . ferrous sulfate 325 (65 FE) MG tablet   Oral   Take 325 mg by mouth daily with breakfast.         . Prenatal Vit-Fe Fumarate-FA (PRENATAL MULTIVITAMIN) TABS   Oral   Take 1 tablet by mouth every morning.           BP 123/61  Pulse 99  Temp(Src) 99.9 F (37.7 C) (Oral)  Resp 20  SpO2 100% Physical Exam  Nursing note and vitals reviewed. Constitutional: She is oriented to person, place, and time. She appears well-developed and well-nourished. No distress.  HENT:  Head: Normocephalic.  Right Ear: External  ear normal.  Left Ear: External ear normal.  Nose: Nose normal.  Mouth/Throat: Oropharynx is clear and moist.  Eyes: Conjunctivae are normal. Pupils are equal, round, and reactive to light.  Neck: Normal range of motion. Neck supple.  Cardiovascular: Normal rate, regular rhythm and normal heart sounds.   Pulmonary/Chest: Effort normal and breath sounds normal. No respiratory distress. She has no wheezes. She has no rales. She exhibits no tenderness.  Abdominal: Soft. Bowel sounds are normal. She exhibits no distension. There is no tenderness. There is no rebound.  Musculoskeletal: She exhibits no edema.  Neurological: She is  alert and oriented to person, place, and time.  Skin: Skin is warm and dry.  Psychiatric: She has a normal mood and affect. Her behavior is normal.    ED Course  Procedures (including critical care time) Labs Review Labs Reviewed  URINALYSIS W MICROSCOPIC + REFLEX CULTURE - Abnormal; Notable for the following:    APPearance CLOUDY (*)    All other components within normal limits  CBC WITH DIFFERENTIAL - Abnormal; Notable for the following:    Neutrophils Relative % 81 (*)    All other components within normal limits  POCT I-STAT, CHEM 8 - Abnormal; Notable for the following:    Potassium 3.5 (*)    BUN 5 (*)    Glucose, Bld 113 (*)    Calcium, Ion 1.11 (*)    All other components within normal limits   Imaging Review Dg Chest 2 View  08/01/2013   CLINICAL DATA:  Headache, cough, fatigue  EXAM: CHEST  2 VIEW  COMPARISON:  None.  FINDINGS: Mild cardiac enlargement. Vascular pattern normal. Lungs clear. No effusions.  IMPRESSION: No acute findings   Electronically Signed   By: Esperanza Heir M.D.   On: 08/01/2013 19:42    EKG Interpretation   None       MDM   1. Flu-like symptoms   2. Cough     Pt with flu like symptoms. She is mainly complaining of headache, cough, fever. Pt denies neck pain or stiffness, she has normal ROM of the neck with no meningismus. No photophobia. Given tylenol for pain and fever. Fluids for dehydration. Labs and CXR ordered.    8:16 PM Labs and CXR unremarkable. Potassium slightly low at 3.5. Pt rehydrated. She states she feels better. Will treat symptomatically for flu like illness. Will prescribe cough medication, tylenol and motrin for fever, fluids at home.   Filed Vitals:   08/01/13 1758  BP: 123/61  Pulse: 99  Temp: 99.9 F (37.7 C)  TempSrc: Oral  Resp: 20  SpO2: 100%       Etna Forquer A Alecxander Mainwaring, PA-C 08/01/13 2340

## 2013-08-01 NOTE — ED Notes (Signed)
Bed: [facility] Expected date:  Expected time:  Means of arrival:  Comments: 

## 2013-08-01 NOTE — ED Notes (Signed)
Per EMS. Pt from doctor's office. Pt has cough and feeling weak. Rest of family sick. MD's office sent here for IV fluids.

## 2013-08-01 NOTE — ED Notes (Signed)
Family at bedside. 

## 2013-08-14 NOTE — ED Provider Notes (Signed)
Medical screening examination/treatment/procedure(s) were performed by non-physician practitioner and as supervising physician I was immediately available for consultation/collaboration.  EKG Interpretation   None         Rolland PorterMark Ritha Sampedro, MD 08/14/13 [phone]

## 2014-05-30 ENCOUNTER — Encounter (HOSPITAL_COMMUNITY): Payer: Self-pay | Admitting: Emergency Medicine

## 2016-09-13 ENCOUNTER — Other Ambulatory Visit: Payer: Self-pay | Admitting: Obstetrics and Gynecology

## 2017-11-26 ENCOUNTER — Emergency Department (HOSPITAL_COMMUNITY): Payer: Worker's Compensation

## 2017-11-26 ENCOUNTER — Other Ambulatory Visit: Payer: Self-pay | Admitting: Family Medicine

## 2017-11-26 ENCOUNTER — Emergency Department (HOSPITAL_COMMUNITY)
Admission: EM | Admit: 2017-11-26 | Discharge: 2017-11-26 | Disposition: A | Discharge status: Home / Self Care | Payer: Worker's Compensation | Attending: Emergency Medicine | Admitting: Emergency Medicine

## 2017-11-26 ENCOUNTER — Ambulatory Visit: Payer: Self-pay

## 2017-11-26 ENCOUNTER — Encounter (HOSPITAL_COMMUNITY): Payer: Self-pay | Admitting: Emergency Medicine

## 2017-11-26 DIAGNOSIS — J3489 Other specified disorders of nose and nasal sinuses: Secondary | ICD-10-CM | POA: Insufficient documentation

## 2017-11-26 DIAGNOSIS — Y92239 Unspecified place in hospital as the place of occurrence of the external cause: Secondary | ICD-10-CM | POA: Insufficient documentation

## 2017-11-26 DIAGNOSIS — Y939 Activity, unspecified: Secondary | ICD-10-CM | POA: Diagnosis not present

## 2017-11-26 DIAGNOSIS — S0990XA Unspecified injury of head, initial encounter: Secondary | ICD-10-CM | POA: Diagnosis present

## 2017-11-26 DIAGNOSIS — S0031XA Abrasion of nose, initial encounter: Secondary | ICD-10-CM

## 2017-11-26 DIAGNOSIS — W228XXA Striking against or struck by other objects, initial encounter: Secondary | ICD-10-CM | POA: Insufficient documentation

## 2017-11-26 DIAGNOSIS — Z79899 Other long term (current) drug therapy: Secondary | ICD-10-CM | POA: Insufficient documentation

## 2017-11-26 DIAGNOSIS — R5383 Other fatigue: Secondary | ICD-10-CM | POA: Insufficient documentation

## 2017-11-26 DIAGNOSIS — R4182 Altered mental status, unspecified: Secondary | ICD-10-CM | POA: Insufficient documentation

## 2017-11-26 DIAGNOSIS — Y998 Other external cause status: Secondary | ICD-10-CM | POA: Diagnosis not present

## 2017-11-26 LAB — CBC WITH DIFFERENTIAL/PLATELET
BASOS ABS: 0 10*3/uL (ref 0.0–0.1)
BASOS PCT: 0 %
Eosinophils Absolute: 0.1 10*3/uL (ref 0.0–0.7)
Eosinophils Relative: 2 %
HEMATOCRIT: 37.3 % (ref 36.0–46.0)
HEMOGLOBIN: 12 g/dL (ref 12.0–15.0)
Lymphocytes Relative: 47 %
Lymphs Abs: 2.5 10*3/uL (ref 0.7–4.0)
MCH: 26.7 pg (ref 26.0–34.0)
MCHC: 32.2 g/dL (ref 30.0–36.0)
MCV: 83.1 fL (ref 78.0–100.0)
MONO ABS: 0.4 10*3/uL (ref 0.1–1.0)
Monocytes Relative: 7 %
NEUTROS PCT: 44 %
Neutro Abs: 2.4 10*3/uL (ref 1.7–7.7)
Platelets: 354 10*3/uL (ref 150–400)
RBC: 4.49 MIL/uL (ref 3.87–5.11)
RDW: 15.1 % (ref 11.5–15.5)
WBC: 5.4 10*3/uL (ref 4.0–10.5)

## 2017-11-26 LAB — COMPREHENSIVE METABOLIC PANEL
ALBUMIN: 4.1 g/dL (ref 3.5–5.0)
ALT: 27 U/L (ref 14–54)
AST: 22 U/L (ref 15–41)
Alkaline Phosphatase: 45 U/L (ref 38–126)
Anion gap: 9 (ref 5–15)
BUN: 17 mg/dL (ref 6–20)
CO2: 25 mmol/L (ref 22–32)
Calcium: 9.1 mg/dL (ref 8.9–10.3)
Chloride: 104 mmol/L (ref 101–111)
Creatinine, Ser: 0.49 mg/dL (ref 0.44–1.00)
GFR calc Af Amer: >60 mL/min (ref >60)
GFR calc non Af Amer: >60 mL/min (ref >60)
GLUCOSE: 86 mg/dL (ref 65–99)
Potassium: 4.3 mmol/L (ref 3.5–5.1)
Sodium: 138 mmol/L (ref 135–145)
TOTAL PROTEIN: 7.7 g/dL (ref 6.5–8.1)
Total Bilirubin: 0.6 mg/dL (ref 0.3–1.2)

## 2017-11-26 LAB — I-STAT BETA HCG BLOOD, ED (MC, WL, AP ONLY)

## 2017-11-26 MED ORDER — ACETAMINOPHEN 500 MG PO TABS
1000.0000 mg | ORAL_TABLET | Freq: Once | ORAL | Status: AC
  Administered 2017-11-26: 1000 mg via ORAL
  Filled 2017-11-26: qty 2

## 2017-11-26 NOTE — ED Notes (Signed)
Pt able to ambulate from wheelchair to stretcher
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
## 2017-11-26 NOTE — ED Notes (Signed)
Pt verbalized understanding discharge instructions and denies any further needs or questions at this time. VS stable, ambulatory and steady gait.   
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
## 2018-11-19 ENCOUNTER — Other Ambulatory Visit: Payer: Self-pay | Admitting: Family Medicine

## 2018-11-19 DIAGNOSIS — Z1231 Encounter for screening mammogram for malignant neoplasm of breast: Secondary | ICD-10-CM

## 2019-01-21 ENCOUNTER — Ambulatory Visit: Payer: Worker's Compensation

## 2019-10-21 ENCOUNTER — Other Ambulatory Visit (HOSPITAL_BASED_OUTPATIENT_CLINIC_OR_DEPARTMENT_OTHER): Payer: Self-pay | Admitting: Family Medicine

## 2019-10-21 ENCOUNTER — Other Ambulatory Visit: Payer: Self-pay

## 2019-10-21 ENCOUNTER — Ambulatory Visit (HOSPITAL_BASED_OUTPATIENT_CLINIC_OR_DEPARTMENT_OTHER)
Admission: RE | Admit: 2019-10-21 | Discharge: 2019-10-21 | Disposition: A | Discharge status: Home / Self Care | Payer: 59 | Source: Ambulatory Visit | Attending: Family Medicine | Admitting: Family Medicine

## 2019-10-21 DIAGNOSIS — I824Y2 Acute embolism and thrombosis of unspecified deep veins of left proximal lower extremity: Secondary | ICD-10-CM | POA: Diagnosis present

## 2021-11-29 IMAGING — US US EXTREM LOW VENOUS*L*
1 series · 13 of 24 positions shown · non-contrast
Comparison: None.

CLINICAL DATA: Left calf region pain.  [id number] positive.

EXAM:
LEFT LOWER EXTREMITY VENOUS DUPLEX ULTRASOUND
TECHNIQUE: Gray-scale sonography with graded compression, as well as color
Doppler and duplex ultrasound were performed to evaluate the left
lower extremity deep venous system from the level of the common
femoral vein and including the common femoral, femoral, profunda
femoral, popliteal and calf veins including the posterior tibial,
peroneal and gastrocnemius veins when visible. The superficial great
saphenous vein was also interrogated. Spectral Doppler was utilized
to evaluate flow at rest and with distal augmentation maneuvers in
the common femoral, femoral and popliteal veins.

[Series 1: us extrem low venous*left* · 36 acquisitions, 13 frames shown]
[im 1/36]
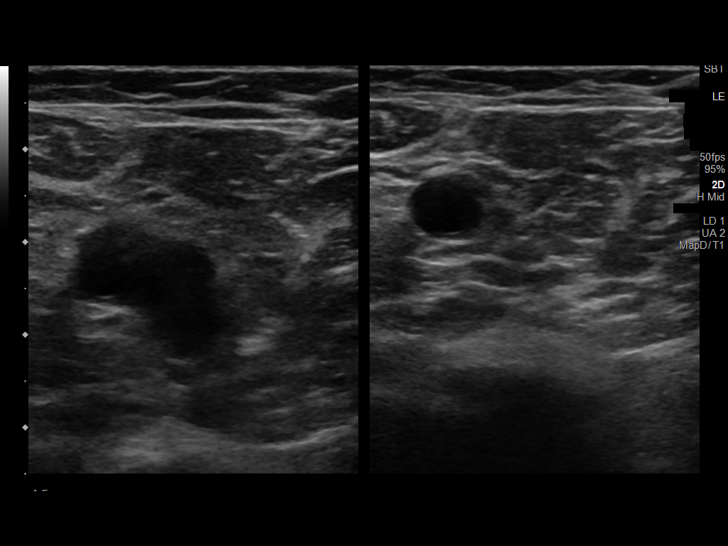
[im 4/36]
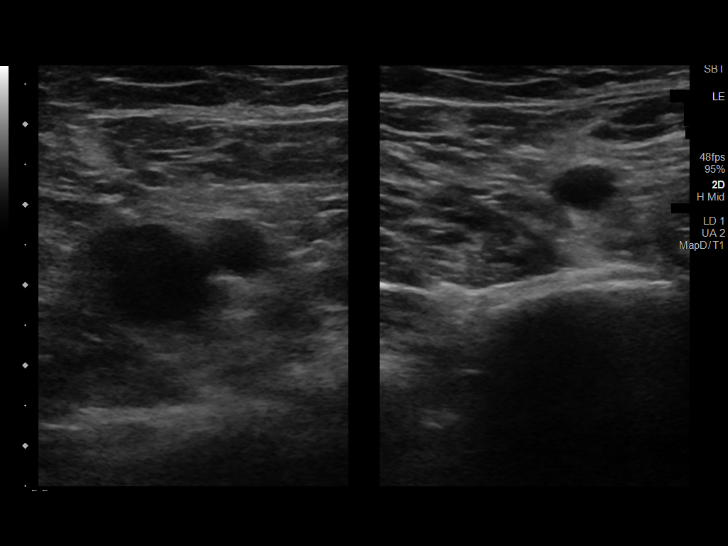
[im 7/36]
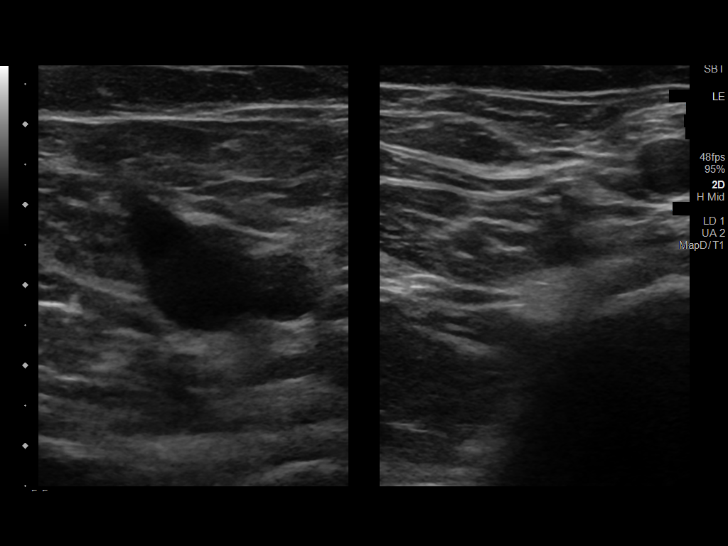
[im 11/36]
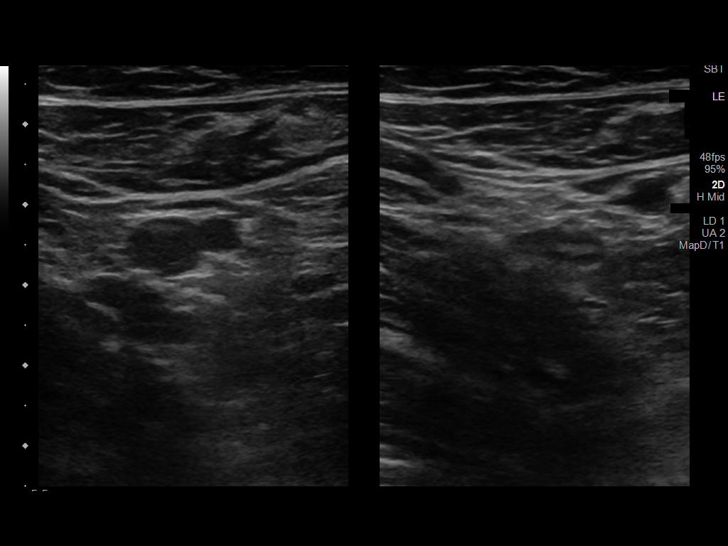
[im 14/36]
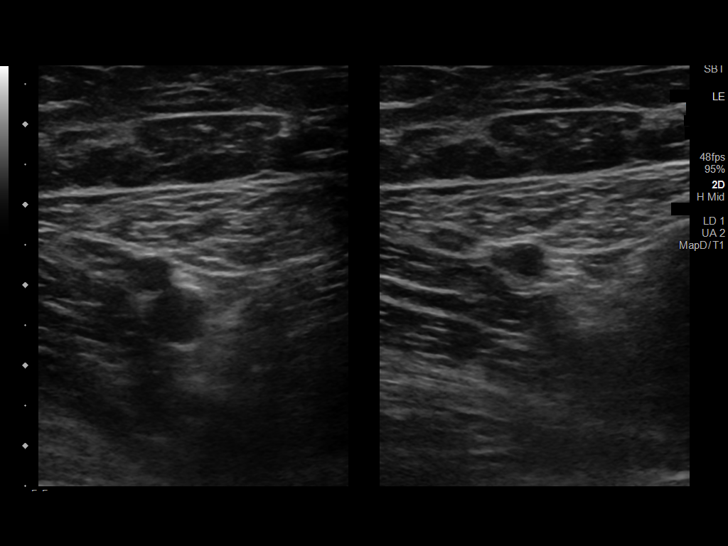
[im 17/36]
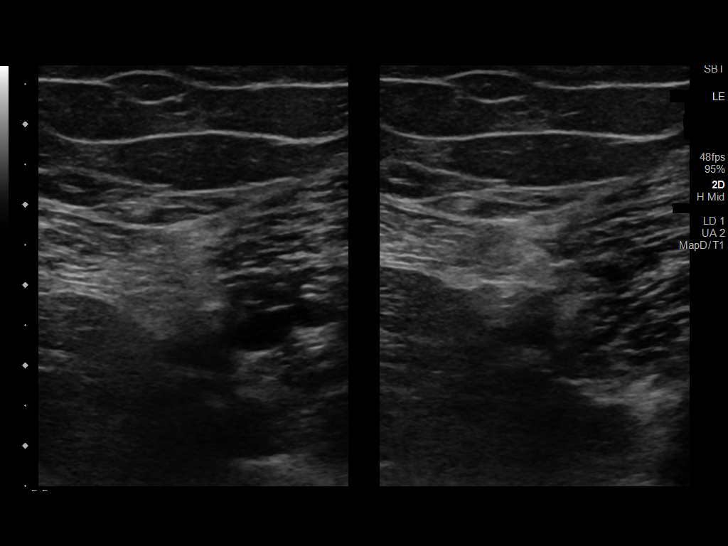
[im 20/36]
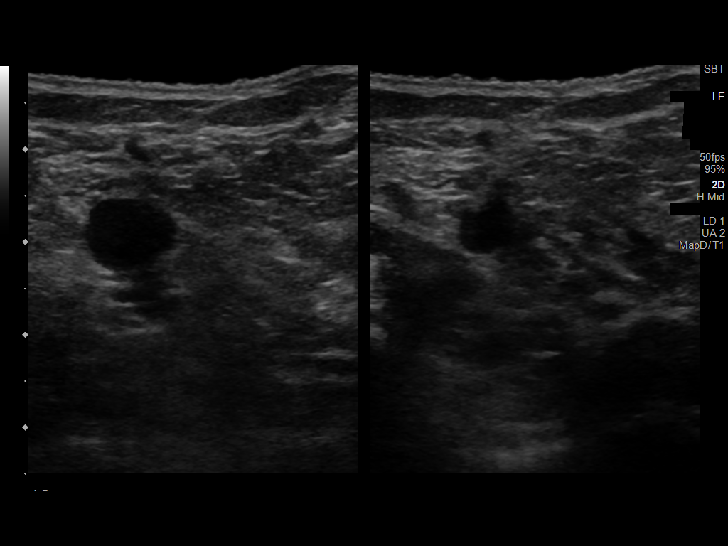
[im 22/36]
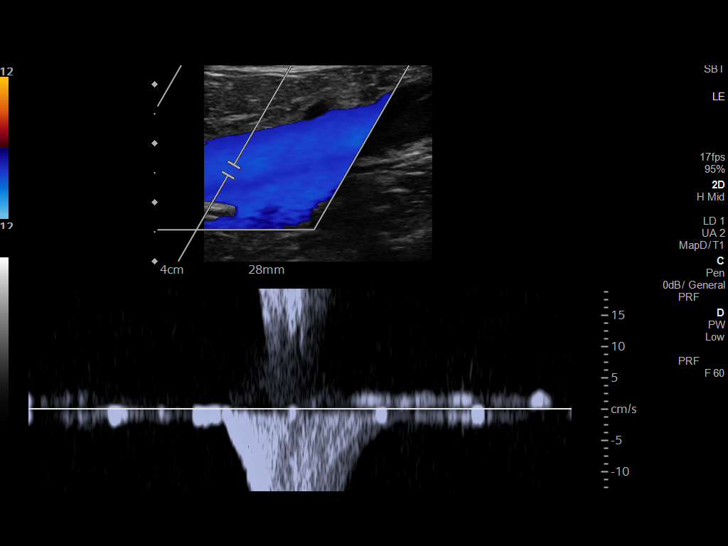
[im 25/36]
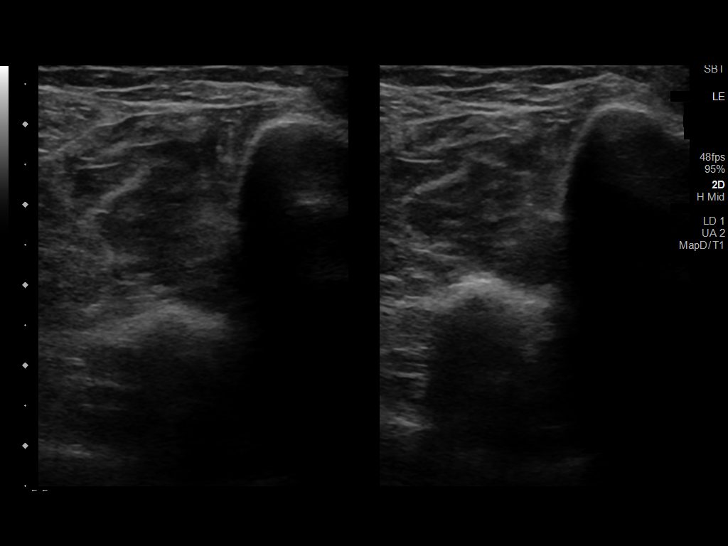
[im 28/36]
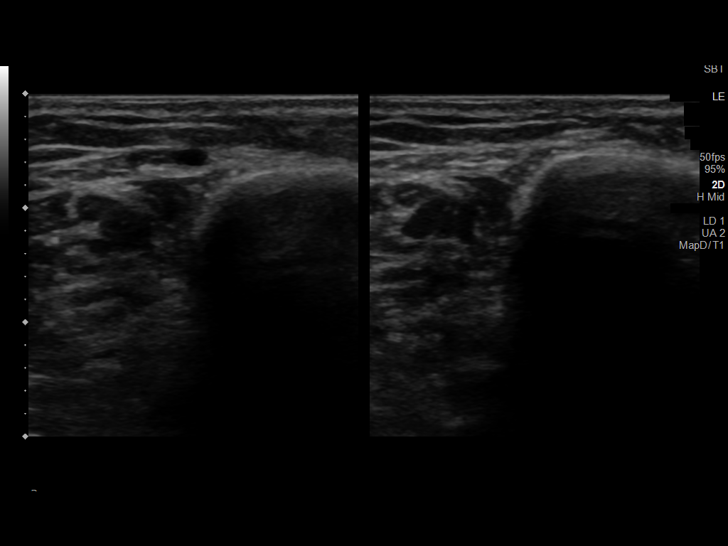
[im 32/36]
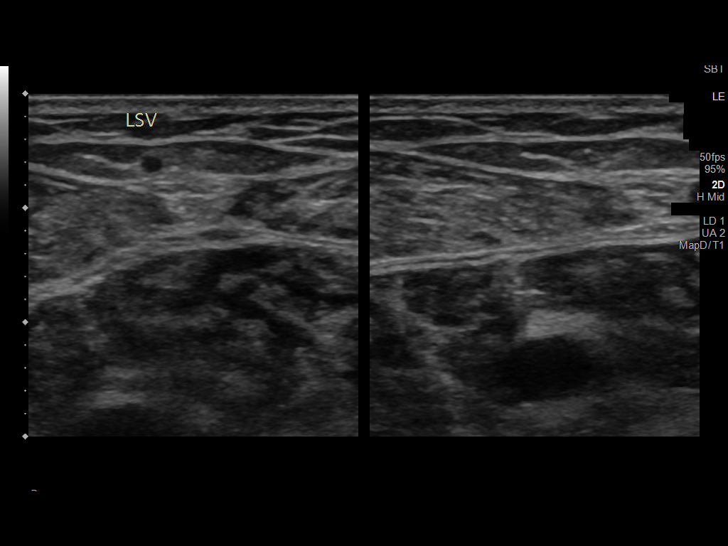
[im 34/36]
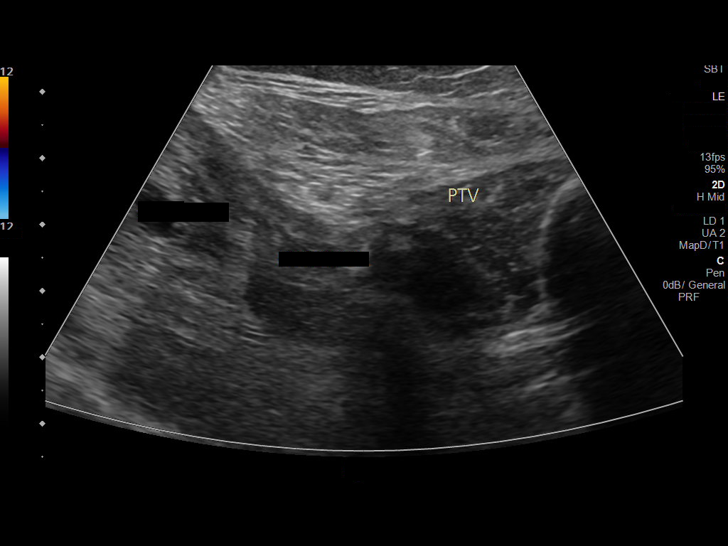
[im 36/36]
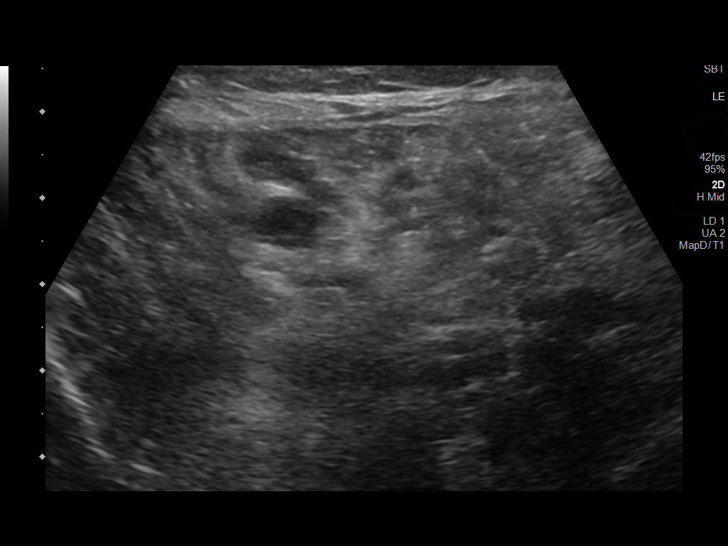

[13 of 24 positions shown; findings below may reference images not displayed]

FINDINGS: Contralateral Common Femoral Vein: Respiratory phasicity is normal
and symmetric with the symptomatic side. No evidence of thrombus.
Normal compressibility.

Common Femoral Vein: No evidence of thrombus. Normal
compressibility, respiratory phasicity and response to augmentation.

Saphenofemoral Junction: No evidence of thrombus. Normal
compressibility and flow on color Doppler imaging.

Profunda Femoral Vein: No evidence of thrombus. Normal
compressibility and flow on color Doppler imaging.

Femoral Vein: No evidence of thrombus. Normal compressibility,
respiratory phasicity and response to augmentation.

Popliteal Vein: No evidence of thrombus. Normal compressibility,
respiratory phasicity and response to augmentation.

Calf Veins: No evidence of thrombus. Normal compressibility and flow
on color Doppler imaging.

Superficial Great Saphenous Vein: No evidence of thrombus. Normal
compressibility.

Venous Reflux:  None.

Other Findings: There is an apparent thrombus in a perforator vein
in the left calf region, a superficial thrombus.
IMPRESSION: No evidence of deep venous thrombosis in the left lower extremity.
Right common femoral vein also patent. There is acute appearing
thrombus in a perforator vein in the left calf region, a superficial
venous structure.
